# Patient Record
Sex: Female | Born: 1965 | Race: White | Hispanic: No | Smoking: Current every day smoker
Health system: Southern US, Community
[De-identification: ages and names within clinical notes are randomized; demographics above are authoritative.]

## PROBLEM LIST (undated history)

## (undated) DIAGNOSIS — M1990 Unspecified osteoarthritis, unspecified site: Secondary | ICD-10-CM

## (undated) DIAGNOSIS — J439 Emphysema, unspecified: Secondary | ICD-10-CM

## (undated) DIAGNOSIS — T7840XA Allergy, unspecified, initial encounter: Secondary | ICD-10-CM

## (undated) DIAGNOSIS — M810 Age-related osteoporosis without current pathological fracture: Secondary | ICD-10-CM

## (undated) DIAGNOSIS — F329 Major depressive disorder, single episode, unspecified: Secondary | ICD-10-CM

## (undated) DIAGNOSIS — F419 Anxiety disorder, unspecified: Secondary | ICD-10-CM

## (undated) DIAGNOSIS — G709 Myoneural disorder, unspecified: Secondary | ICD-10-CM

## (undated) DIAGNOSIS — K219 Gastro-esophageal reflux disease without esophagitis: Secondary | ICD-10-CM

## (undated) DIAGNOSIS — D649 Anemia, unspecified: Secondary | ICD-10-CM

## (undated) DIAGNOSIS — Z5189 Encounter for other specified aftercare: Secondary | ICD-10-CM

## (undated) DIAGNOSIS — R519 Headache, unspecified: Secondary | ICD-10-CM

## (undated) DIAGNOSIS — S72009A Fracture of unspecified part of neck of unspecified femur, initial encounter for closed fracture: Secondary | ICD-10-CM

## (undated) DIAGNOSIS — E079 Disorder of thyroid, unspecified: Secondary | ICD-10-CM

## (undated) DIAGNOSIS — E785 Hyperlipidemia, unspecified: Secondary | ICD-10-CM

## (undated) DIAGNOSIS — R4589 Other symptoms and signs involving emotional state: Secondary | ICD-10-CM

## (undated) DIAGNOSIS — J449 Chronic obstructive pulmonary disease, unspecified: Secondary | ICD-10-CM

## (undated) DIAGNOSIS — R4689 Other symptoms and signs involving appearance and behavior: Secondary | ICD-10-CM

## (undated) DIAGNOSIS — F319 Bipolar disorder, unspecified: Secondary | ICD-10-CM

## (undated) DIAGNOSIS — G4730 Sleep apnea, unspecified: Secondary | ICD-10-CM

## (undated) DIAGNOSIS — F32A Depression, unspecified: Secondary | ICD-10-CM

## (undated) DIAGNOSIS — R011 Cardiac murmur, unspecified: Secondary | ICD-10-CM

## (undated) DIAGNOSIS — E039 Hypothyroidism, unspecified: Secondary | ICD-10-CM

## (undated) DIAGNOSIS — S129XXA Fracture of neck, unspecified, initial encounter: Secondary | ICD-10-CM

## (undated) HISTORY — DX: Age-related osteoporosis without current pathological fracture: M81.0

## (undated) HISTORY — PX: HIP SURGERY: SHX245

## (undated) HISTORY — DX: Encounter for other specified aftercare: Z51.89

## (undated) HISTORY — DX: Major depressive disorder, single episode, unspecified: F32.9

## (undated) HISTORY — DX: Anxiety disorder, unspecified: F41.9

## (undated) HISTORY — DX: Depression, unspecified: F32.A

## (undated) HISTORY — DX: Sleep apnea, unspecified: G47.30

## (undated) HISTORY — DX: Unspecified osteoarthritis, unspecified site: M19.90

## (undated) HISTORY — DX: Allergy, unspecified, initial encounter: T78.40XA

## (undated) HISTORY — DX: Disorder of thyroid, unspecified: E07.9

## (undated) HISTORY — DX: Cardiac murmur, unspecified: R01.1

## (undated) HISTORY — PX: CLAVICLE SURGERY: SHX598

## (undated) HISTORY — PX: TUBAL LIGATION: SHX77

## (undated) HISTORY — DX: Anemia, unspecified: D64.9

## (undated) HISTORY — DX: Hyperlipidemia, unspecified: E78.5

## (undated) HISTORY — DX: Gastro-esophageal reflux disease without esophagitis: K21.9

## (undated) HISTORY — PX: HERNIA REPAIR: SHX51

## (undated) HISTORY — PX: NECK SURGERY: SHX720

## (undated) HISTORY — DX: Myoneural disorder, unspecified: G70.9

## (undated) HISTORY — PX: CHOLECYSTECTOMY: SHX55

---

## 2001-06-26 ENCOUNTER — Inpatient Hospital Stay (HOSPITAL_COMMUNITY): Admission: EM | Admit: 2001-06-26 | Discharge: 2001-07-04 | Payer: Self-pay | Admitting: *Deleted

## 2007-04-29 ENCOUNTER — Ambulatory Visit (HOSPITAL_COMMUNITY): Admission: RE | Admit: 2007-04-29 | Discharge: 2007-04-29 | Payer: Self-pay | Admitting: Neurosurgery

## 2007-05-09 ENCOUNTER — Ambulatory Visit (HOSPITAL_COMMUNITY): Admission: RE | Admit: 2007-05-09 | Discharge: 2007-05-10 | Payer: Self-pay | Admitting: Neurosurgery

## 2007-11-30 ENCOUNTER — Inpatient Hospital Stay (HOSPITAL_COMMUNITY): Admission: AD | Admit: 2007-11-30 | Discharge: 2007-12-03 | Payer: Self-pay | Admitting: *Deleted

## 2007-11-30 ENCOUNTER — Ambulatory Visit: Payer: Self-pay | Admitting: *Deleted

## 2011-04-17 NOTE — Op Note (Signed)
NAME:  Teresa Chaney, Teresa Chaney NO.:  [id number]   MEDICAL RECORD NO.:  [id number]          PATIENT TYPE:  OIB   LOCATION:  3020                         FACILITY:  [facility]   PHYSICIAN:  Coletta Memos, M.D.     DATE OF BIRTH:  1966-03-31   DATE OF PROCEDURE:  05/10/2007  DATE OF DISCHARGE:                               OPERATIVE REPORT   PREOPERATIVE DIAGNOSES:  1. Cervical spondylosis without myelopathy, C4-5.  2. Cervical radiculopathy, C5.   POSTOPERATIVE DIAGNOSES:  1. Cervical spondylosis without myelopathy, C4-5.  2. Cervical radiculopathy, C5.   PROCEDURES:  1. Anterior cervical decompression, C4-5.  2. Arthrodesis, C4-5, with 6 mm lordotic CCACF graft.  3. Anterior instrumentation, 14 mm Vector plate with 14 mm screws.   COMPLICATIONS:  None.   SURGEON:  Coletta Memos, M.D.   ASSISTANT:  Cristi Loron, M.D.   INDICATIONS:  Teresa Chaney presented with pain in the neck and right  upper extremity.  She has foraminal narrowing on the right side at C4-5.  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  I offered and she agreed to undergo operative  decompression at this level for her right upper extremity pain.   OPERATIVE NOTE:  Ms. Gopal was brought to the operating room, intubated,  and then placed under a general anesthetic without difficulty.  Her neck  was prepped and she was draped in a sterile fashion after being placed  on a horseshoe headrest in a neutral position.  I infiltrated 3 mL of  0.5% lidocaine and 1:200,000 epinephrine starting from the midline and  extending to the medial border of the left sternocleidomastoid just  below the thyroid cartilage.  I opened the skin with a #10 blade and I  took the initial incision down through the skin to the level of the  platysma.  I dissected rostrally and caudally in a plane above the  platysma.  I then opened the platysma in a horizontal fashion  using  Metzenbaum scissors.  I then dissected rostrally and caudally in a plane  inferior to the platysma.  Using a Metzenbaum scissors I was able to  dissect into a plane between the sternocleidomastoid and medial strap  muscles.  I retracted the medial strap muscles, esophagus medially and  the carotid artery and sternocleidomastoid laterally.  I placed a spinal  needle into the cervical spine and it was at C4-5.  I then reflected the  longus colli muscles, placed self-retaining retractors, and prepared for  the decompression.   I performed an anterior cervical decompression at C4-5 first by opening  the disk space.  I then used curettes to remove disk and endplate from  the vertebral bodies of C4 and C5.  I then used a high-speed drill along  with Kerrison punches to remove what was thickened posterior  longitudinal ligament.  I also used a Kerrison punch to remove  osteophytes posteriorly.  I then used a drill to thin out laterally in  the uncovertebral joints until I fully decompressed the C5  nerve root on  the right side.  The C5 nerve root on left side was also decompressed,  though there was not nearly as large an osteophyte on that side.  After  satisfying myself with decompression of both the spinal canal at that  level, which was effected with the Kerrison punches and drill, and the  nerve roots, I then irrigated.  I then prepared for the arthrodesis.   I drilled down the endplates of the vertebral bodies to prepare for the  arthrodesis.  I sized the space and felt that a 6 mm graft would work  best.  I then placed a 6 mm lordotic graft into the space.  The  distraction pins which I had placed at C4 and C5 in order aid in the  decompression were then removed.  I then proceeded with my  instrumentation.   I placed a 14 mm plate and then used a hand drill and self-tapping  screws with Dr. Lovell Sheehan' assistance and placed four screws over the  plate in place.  I then irrigated  the wound.  I then closed the wound in  a layered fashion after x-ray showed that the plate, plug and screws  were in good position.  I used Vicryl to reapproximate the platysma and  subcuticular layers.  Dermabond used for a sterile dressing.           ______________________________  Coletta Memos, M.D.     KC/MEDQ  D:  05/09/2007  T:  05/10/2007  Job:  [id number]
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
## 2011-04-20 NOTE — Discharge Summary (Signed)
[facility]  Patient:    Teresa Chaney, Teresa Chaney Visit Number: [id number] MRN: [id number]          Service Type: PSY Location: 50 0508 02 Attending Physician:  Denny Peon Dictated by:   Netta Cedars, M.D. Admit Date:  06/26/2001 Discharge Date: 07/04/2001                             Discharge Summary  INTRODUCTION:  Teresa Chaney is a 45 year old white married female, who was involuntarily committed after overdosing on July 24th on Valium and trazodone. The patient has history of depression for years.  She overdosed on 15 tablets of 5 mg Valium, 25 tablets of trazodone 50 mg and 8 Protonix tablets.  She left a note for family.  Friend found her and was taken to emergency room. The patient has several stressors.  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  The patients husband did not feel that she needed medication and he wants her to have another child, which is not in line with patients thinking.  She reports mood swings and racing thoughts.  At the time of evaluation, she still had some suicidal thoughts but not specific plans or intention and she was able to contract for safety.  The patient has been under care of [facility].  She has history of suicidal attempt at the age of 13.  SUBSTANCE ABUSE:  The patient does occasionally binge-drinking.  She denies any other substance abuse.  MEDICAL HISTORY:  She has problems with GERD and TMJ.  INITIAL IMPRESSION: Axis I:    Major depression, recurrent. Axis II:   Deferred. Axis III: Axis IV: Axis V:    Global Assessment of Functioning upon admission 20; for past year            maximum of 69.  Details of patients history are available in admission note.  HOSPITAL COURSE:  After admitting to the ward, patient was placed on special observation.  She was started on Effexor XR 37.5 mg twice a day, which was increased subsequently to 75 mg twice a  day.  Because of racing thoughts, she was started on Zyprexa.  The patient feels drugged on Zyprexa and Zyprexa was discontinued and Ambien instead was introduced for insomnia.  For next few days, Effexor was increased to 150 mg in the morning and 75 mg at night and trazodone was used for sleep.  During the meeting, on July 30, patient gave brother background information which was highly suggestive for bipolar-type illness with periods of elated mood interwoven with long periods of depression.  No history of manic episode.  I explained to patient possibility of treatment with lithium and she agreed to try.  Side effects were explained. On July 02, 2001, she seemed to be doing better but, after conversation with husband, she became more apprehensive and started having, once again, suicidal thoughts.  I decided to further increases Effexor to stabilize patients depression.  Family meeting was scheduled.  On July 03, 2001, a family meeting was held.  The patient denied any dangerous ideations.  She felt that she is ready to go home.  Husband seemed to be supportive but having vague idea about the character of patients problems.  She was doing better on lithium level 0.62.  On July 04, 2001, patient continued improved course. Denied dangerous ideations.  Still some anxiety but  will control.  Lithium was helping in stabilizing mood.  Side effects were once again discussed.  It was felt that patient benefited from this hospitalization and could be safely discharged home.  The patient understood instructions of medications and possible side effects of lithium including signs of toxicity.  MEDICAL PROBLEMS:  During this hospitalization, the patient did not have any medical problems.  PHYSICAL EXAMINATION:  Her vital signs were stable throughout hospitalization with blood pressure 120/76, normal temperature, respiration rate and pulse.  LABORATORY DATA:  The patient had normal EKG from  [facility]. Electrolytes, BUN and creatinine were normal.  CBC and differential were normal.  Chemistry 17 normal.  Urine drug screen was positive for benzodiazepines and opiates.  Pregnancy test was negative.  The patient was warned to use precautions against pregnancy while on lithium.  Thyroid panel was normal with T4 7.8, T3 30.1 and TSH 258.  DISCHARGE DIAGNOSES: Axis I:    1. Bipolar disorder, type 2, depressed.            2. Status post suicidal attempt. 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 Axis III:  1. Gastroesophageal reflux disease.            2. Temporomandibular joint.            3. Status post suicidal attempt by overdose on medication. Axis IV:   Moderate stressors (related to family problems, economical            problems). Axis V:    Global Assessment of Functioning upon admission 20; upon discharge            65; maximum for past year 69.  DISCHARGE MEDICATIONS: 1. Lithobid 300 mg twice a day. 2. Effexor XR 150 mg twice a day. 3. Seroquel 25 mg, 1/2 or 1 at bedtime, which should be used because of    insomnia and racing thoughts. 4. Lorazepam 0.5 mg up to four times a day for anxiety.  DISCHARGE RECOMMENDATIONS:  Husband agreed help to secure medication.  The patient should avoid dehydration or expose to sun while on medication.  She should check lithium level in the next 7-10 days.  Should call if problems with medication or recurrence of symptoms.  The patient was warned about lithium toxicity and lithium effects upon pregnancy.  She has appointment with [facility] on July 17, 2001 at 3:30 p.m. with Dr. Mady Haagensen.  The patient was also supposed to follow up with individual therapy.  The patient understood instructions and, in good condition, was discharged home. Dictated by:   Netta Cedars, M.D. Attending Physician:  Denny Peon DD:  08/26/01 TD:  08/27/01 Job: [id number]
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
## 2011-09-20 LAB — CBC
HCT: 37.1
Hemoglobin: 12.2
MCHC: 32.8
MCV: 85
Platelets: 241
RBC: 4.37
RDW: 14.8 — ABNORMAL HIGH
WBC: 6.6

## 2014-01-25 ENCOUNTER — Emergency Department (HOSPITAL_COMMUNITY)
Admission: EM | Admit: 2014-01-25 | Discharge: 2014-01-25 | Disposition: A | Discharge status: Home / Self Care | Payer: Medicare Other | Attending: Emergency Medicine | Admitting: Emergency Medicine

## 2014-01-25 ENCOUNTER — Encounter (HOSPITAL_COMMUNITY): Payer: Self-pay | Admitting: Emergency Medicine

## 2014-01-25 ENCOUNTER — Inpatient Hospital Stay (HOSPITAL_COMMUNITY): Admission: AD | Admit: 2014-01-25 | Payer: Medicare Other | Source: Intra-hospital | Admitting: Psychiatry

## 2014-01-25 DIAGNOSIS — F17200 Nicotine dependence, unspecified, uncomplicated: Secondary | ICD-10-CM | POA: Insufficient documentation

## 2014-01-25 DIAGNOSIS — Z8781 Personal history of (healed) traumatic fracture: Secondary | ICD-10-CM | POA: Insufficient documentation

## 2014-01-25 DIAGNOSIS — F4310 Post-traumatic stress disorder, unspecified: Secondary | ICD-10-CM

## 2014-01-25 DIAGNOSIS — F39 Unspecified mood [affective] disorder: Secondary | ICD-10-CM

## 2014-01-25 DIAGNOSIS — F329 Major depressive disorder, single episode, unspecified: Secondary | ICD-10-CM | POA: Insufficient documentation

## 2014-01-25 DIAGNOSIS — F32A Depression, unspecified: Secondary | ICD-10-CM

## 2014-01-25 DIAGNOSIS — R45851 Suicidal ideations: Secondary | ICD-10-CM | POA: Insufficient documentation

## 2014-01-25 DIAGNOSIS — F3289 Other specified depressive episodes: Secondary | ICD-10-CM

## 2014-01-25 DIAGNOSIS — Z79899 Other long term (current) drug therapy: Secondary | ICD-10-CM | POA: Insufficient documentation

## 2014-01-25 DIAGNOSIS — Z791 Long term (current) use of non-steroidal anti-inflammatories (NSAID): Secondary | ICD-10-CM | POA: Insufficient documentation

## 2014-01-25 DIAGNOSIS — F411 Generalized anxiety disorder: Secondary | ICD-10-CM

## 2014-01-25 HISTORY — DX: Other symptoms and signs involving emotional state: R45.89

## 2014-01-25 HISTORY — DX: Other symptoms and signs involving appearance and behavior: R46.89

## 2014-01-25 HISTORY — DX: Fracture of unspecified part of neck of unspecified femur, initial encounter for closed fracture: S72.009A

## 2014-01-25 HISTORY — DX: Fracture of neck, unspecified, initial encounter: S12.9XXA

## 2014-01-25 LAB — CBC WITH DIFFERENTIAL/PLATELET
Basophils Absolute: 0 10*3/uL (ref 0.0–0.1)
Basophils Relative: 0 % (ref 0–1)
Eosinophils Absolute: 0.1 10*3/uL (ref 0.0–0.7)
Eosinophils Relative: 1 % (ref 0–5)
HCT: 40.6 % (ref 36.0–46.0)
Hemoglobin: 13.4 g/dL (ref 12.0–15.0)
Lymphocytes Relative: 32 % (ref 12–46)
Lymphs Abs: 2.2 10*3/uL (ref 0.7–4.0)
MCH: 28.3 pg (ref 26.0–34.0)
MCHC: 33 g/dL (ref 30.0–36.0)
MCV: 85.8 fL (ref 78.0–100.0)
Monocytes Absolute: 0.5 10*3/uL (ref 0.1–1.0)
Monocytes Relative: 7 % (ref 3–12)
Neutro Abs: 4 10*3/uL (ref 1.7–7.7)
Neutrophils Relative %: 59 % (ref 43–77)
Platelets: 293 10*3/uL (ref 150–400)
RBC: 4.73 MIL/uL (ref 3.87–5.11)
RDW: 14.5 % (ref 11.5–15.5)
WBC: 6.8 10*3/uL (ref 4.0–10.5)

## 2014-01-25 LAB — RAPID URINE DRUG SCREEN, HOSP PERFORMED
Amphetamines: NOT DETECTED
Barbiturates: NOT DETECTED
Benzodiazepines: POSITIVE — AB
Cocaine: NOT DETECTED
Opiates: NOT DETECTED
Tetrahydrocannabinol: NOT DETECTED

## 2014-01-25 LAB — COMPREHENSIVE METABOLIC PANEL
ALT: 6 U/L (ref 0–35)
AST: 12 U/L (ref 0–37)
Albumin: 4 g/dL (ref 3.5–5.2)
Alkaline Phosphatase: 50 U/L (ref 39–117)
BUN: 6 mg/dL (ref 6–23)
CO2: 26 mEq/L (ref 19–32)
Calcium: 9.4 mg/dL (ref 8.4–10.5)
Chloride: 101 mEq/L (ref 96–112)
Creatinine, Ser: 0.65 mg/dL (ref 0.50–1.10)
GFR calc Af Amer: >90 mL/min (ref >90)
GFR calc non Af Amer: >90 mL/min (ref >90)
Glucose, Bld: 87 mg/dL (ref 70–99)
Potassium: 4.3 mEq/L (ref 3.7–5.3)
Sodium: 138 mEq/L (ref 137–147)
Total Bilirubin: 0.2 mg/dL — ABNORMAL LOW (ref 0.3–1.2)
Total Protein: 7.2 g/dL (ref 6.0–8.3)

## 2014-01-25 LAB — ETHANOL: Alcohol, Ethyl (B): <11 mg/dL (ref 0–11)

## 2014-01-25 MED ORDER — ACETAMINOPHEN 325 MG PO TABS
650.0000 mg | ORAL_TABLET | ORAL | Status: DC | PRN
Start: 2014-01-25 — End: 2014-01-26

## 2014-01-25 MED ORDER — ALUM & MAG HYDROXIDE-SIMETH 200-200-20 MG/5ML PO SUSP: 30.0000 mL | ORAL | Status: DC | PRN

## 2014-01-25 MED ORDER — LORAZEPAM 1 MG PO TABS: 1.0000 mg | ORAL_TABLET | Freq: Three times a day (TID) | ORAL | Status: DC | PRN

## 2014-01-25 MED ORDER — ONDANSETRON HCL 4 MG PO TABS: 4.0000 mg | ORAL_TABLET | Freq: Three times a day (TID) | ORAL | Status: DC | PRN

## 2014-01-25 MED ORDER — PANTOPRAZOLE SODIUM 40 MG PO TBEC: 40.0000 mg | DELAYED_RELEASE_TABLET | Freq: Every day | ORAL | Status: DC

## 2014-01-25 MED ORDER — NICOTINE 21 MG/24HR TD PT24: 21.0000 mg | MEDICATED_PATCH | Freq: Every day | TRANSDERMAL | Status: DC

## 2014-01-25 MED ORDER — IBUPROFEN 200 MG PO TABS: 600.0000 mg | ORAL_TABLET | Freq: Three times a day (TID) | ORAL | Status: DC | PRN

## 2014-01-25 MED ORDER — ZOLPIDEM TARTRATE 5 MG PO TABS: 5.0000 mg | ORAL_TABLET | Freq: Every evening | ORAL | Status: DC | PRN

## 2014-01-25 NOTE — ED Notes (Signed)
Pt states therapist from [facility] sent her for evaluation; for depression/isolating herself and suicidal thoughts without a plan; previous stay at [facility] for attempted suicide 2002

## 2014-01-25 NOTE — Discharge Instructions (Signed)
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
## 2014-01-25 NOTE — ED Notes (Signed)
Patient contracts for safety at discharge and denies SI, HI, and AVH at this time.

## 2014-01-25 NOTE — ED Provider Notes (Signed)
CSN: [id number]     Arrival date & time 01/25/14  1347 History   First MD Initiated Contact with Patient 01/25/14 1510     Chief Complaint  Patient presents with  . Medical Clearance     (Consider location/radiation/quality/duration/timing/severity/associated sxs/prior Treatment) The history is provided by the patient and medical records.   This is a 48 y.o. F presenting to the ED for increased depression, anxiety, and suicidal ideation without plan.  Pt states over the past several weeks her sx have been steadily worsening.  Denies any recent new stressors at home but states she thinks her PTSD is flaring up again and causing her to have racing thoughts.  She states she has stopped going to her therapy and pain management appointments as has basically been isolating herself at home alone.  She notes poor PO intake and decreased sleep due to racing thoughts.  She denies HI or AVH.  No illicit drug or EtOH.  States she finally decided to discuss this with her counselor at [facility] who encouraged her to be evaluated in the ED.  Pt had prior suicide attempt in 2002 via OD-- completed IP tx at [facility] after this.  VS stable on arrival.  Past Medical History  Diagnosis Date  . Suicidal behavior   . Hip fracture   . Neck fracture    Past Surgical History  Procedure Laterality Date  . Neck surgery    . Clavicle surgery    . Hip surgery     No family history on file. History  Substance Use Topics  . Smoking status: Current Every Day Smoker -- 1.00 packs/day    Types: Cigarettes  . Smokeless tobacco: Not on file  . Alcohol Use: No   OB History   Grav Para Term Preterm Abortions TAB SAB Ect Mult Living                 Review of Systems  Psychiatric/Behavioral: Positive for suicidal ideas.  All other systems reviewed and are negative.   Allergies  Codeine and Tussin  Home Medications   Current Outpatient Rx  Name  Route  Sig  Dispense  Refill  . acetaminophen (TYLENOL) 325 MG  tablet   Oral   Take 650 mg by mouth every 6 (six) hours as needed (pain).         Marland Kitchen HYDROcodone-acetaminophen (NORCO/VICODIN) 5-325 MG per tablet   Oral   Take 1 tablet by mouth every 6 (six) hours as needed for moderate pain (pain).         Marland Kitchen lansoprazole (PREVACID) 15 MG capsule   Oral   Take 15 mg by mouth daily at 12 noon.         . meloxicam (MOBIC) 7.5 MG tablet   Oral   Take 7.5 mg by mouth every 12 (twelve) hours.         . ondansetron (ZOFRAN) 4 MG tablet   Oral   Take 4 mg by mouth every 8 (eight) hours as needed for nausea or vomiting (nausea).          BP 113/70  Pulse 89  Temp(Src) 98.2 F (36.8 C)  Resp 20  SpO2 100%  LMP 01/11/2014  Physical Exam  Nursing note and vitals reviewed. Constitutional: She is oriented to person, place, and time. She appears well-developed and well-nourished. No distress.  HENT:  Head: Normocephalic and atraumatic.  Mouth/Throat: Oropharynx is clear and moist.  Eyes: Conjunctivae and EOM are normal. Pupils are equal,  round, and reactive to light.  Neck: Normal range of motion.  Cardiovascular: Normal rate, regular rhythm and normal heart sounds.   Pulmonary/Chest: Effort normal and breath sounds normal. No respiratory distress. She has no wheezes.  Abdominal: Soft. Bowel sounds are normal. There is no tenderness.  Musculoskeletal: Normal range of motion. She exhibits no edema.  Neurological: She is alert and oriented to person, place, and time.  Skin: Skin is warm and dry. She is not diaphoretic.  Psychiatric: Her speech is normal. She is not actively hallucinating. Thought content is not delusional. She exhibits a depressed mood. She expresses suicidal ideation. She expresses no homicidal ideation. She expresses no suicidal plans and no homicidal plans.  Depressed mood    ED Course  Procedures (including critical care time) Labs Review Labs Reviewed  URINE RAPID DRUG SCREEN (HOSP PERFORMED) - Abnormal; Notable  for the following:    Benzodiazepines POSITIVE (*)    All other components within normal limits  COMPREHENSIVE METABOLIC PANEL - Abnormal; Notable for the following:    Total Bilirubin 0.2 (*)    All other components within normal limits  CBC WITH DIFFERENTIAL  ETHANOL   Imaging Review No results found.  EKG Interpretation   None       MDM   Final diagnoses:  Depression   Pt with increased depression and anxiety over the past several weeks, now with SI without plan.  Labs obtained, largely WNL.  Pt medically cleared and moved to psych ED awaiting TTS evaluation.  TTS has evaluated pt, feels that since she is able to contract for safety and has OP resources including her current therapist at [facility] she may be reasonably discharged.  Larene Pickett, PA-C 01/26/14 0006

## 2014-01-25 NOTE — Progress Notes (Signed)
   CARE MANAGEMENT ED NOTE 01/25/2014  Patient:  Teresa Chaney, Teresa Chaney   Account Number:  [id number]  Date Initiated:  01/25/2014  Documentation initiated by:  Jackelyn Poling  Subjective/Objective Assessment:   48 yr old medicare pt states pcp is Dr Heide Scales increased depression, anxiety, and suicidal ideation without plan. PMH hip/neck fx/Suidical behavior     Subjective/Objective Assessment Detail:     Action/Plan:   cm spoke with pt updated epic   Action/Plan Detail:   Anticipated DC Date:       Status Recommendation to Physician:   Result of Recommendation:    Other ED Services  Consult Working [facility]  Other  Outpatient Services - Pt will follow up  PCP issues    Choice offered to / List presented to:            Status of service:  Completed, signed off  ED Comments:   ED Comments Detail:

## 2014-01-26 NOTE — ED Provider Notes (Signed)
Medical screening examination/treatment/procedure(s) were conducted as a shared visit with non-physician practitioner(s) and myself.  I personally evaluated the patient during the encounter.  EKG Interpretation   None       Patient not suicidal on my re-examination. Psych has evaluated patient and feels she is stable for outpatient management. Discussed strict return precautions with patient.   Ephraim Hamburger, MD 01/26/14 [phone]

## 2017-02-15 DIAGNOSIS — Z803 Family history of malignant neoplasm of breast: Secondary | ICD-10-CM | POA: Diagnosis not present

## 2017-02-15 DIAGNOSIS — Z720 Tobacco use: Secondary | ICD-10-CM | POA: Diagnosis not present

## 2017-02-15 DIAGNOSIS — Z800 Family history of malignant neoplasm of digestive organs: Secondary | ICD-10-CM | POA: Diagnosis not present

## 2017-02-15 DIAGNOSIS — D751 Secondary polycythemia: Secondary | ICD-10-CM | POA: Diagnosis not present

## 2018-07-29 ENCOUNTER — Encounter: Payer: Self-pay | Admitting: Gastroenterology

## 2018-12-12 ENCOUNTER — Encounter: Payer: Self-pay | Admitting: Gastroenterology

## 2019-01-13 ENCOUNTER — Ambulatory Visit (AMBULATORY_SURGERY_CENTER): Payer: Self-pay

## 2019-01-13 VITALS — Ht 63.0 in | Wt 131.2 lb

## 2019-01-13 DIAGNOSIS — Z1211 Encounter for screening for malignant neoplasm of colon: Secondary | ICD-10-CM

## 2019-01-13 MED ORDER — NA SULFATE-K SULFATE-MG SULF 17.5-3.13-1.6 GM/177ML PO SOLN: 1.0000 | Freq: Once | ORAL | 0 refills | Status: AC

## 2019-01-13 NOTE — Progress Notes (Signed)
Denies allergies to eggs or soy products. Denies complication of anesthesia or sedation. Denies use of weight loss medication. Denies use of O2.   Emmi instructions were given to the patient.

## 2019-01-20 ENCOUNTER — Ambulatory Visit (AMBULATORY_SURGERY_CENTER): Payer: Medicare Other | Admitting: Gastroenterology

## 2019-01-20 ENCOUNTER — Encounter: Payer: Self-pay | Admitting: Gastroenterology

## 2019-01-20 VITALS — BP 116/73 | HR 80 | Temp 97.5°F | Resp 14 | Ht 63.0 in | Wt 131.0 lb

## 2019-01-20 DIAGNOSIS — Z1211 Encounter for screening for malignant neoplasm of colon: Secondary | ICD-10-CM | POA: Diagnosis not present

## 2019-01-20 DIAGNOSIS — D123 Benign neoplasm of transverse colon: Secondary | ICD-10-CM | POA: Diagnosis not present

## 2019-01-20 MED ORDER — SODIUM CHLORIDE 0.9 % IV SOLN: 500.0000 mL | Freq: Once | INTRAVENOUS | Status: DC

## 2019-01-20 NOTE — Progress Notes (Signed)
Called to room to assist during endoscopic procedure.  Patient ID and intended procedure confirmed with present staff. Received instructions for my participation in the procedure from the performing physician.  

## 2019-01-20 NOTE — Patient Instructions (Signed)
Discharge instructions given. Handouts on polyps,diverticulosis and hemorrhoids. Resume previous medications. YOU HAD AN ENDOSCOPIC PROCEDURE TODAY AT THE [facility]:   Refer to the procedure report that was given to you for any specific questions about what was found during the examination.  If the procedure report does not answer your questions, please call your gastroenterologist to clarify.  If you requested that your care partner not be given the details of your procedure findings, then the procedure report has been included in a sealed envelope for you to review at your convenience later.  YOU SHOULD EXPECT: Some feelings of bloating in the abdomen. Passage of more gas than usual.  Walking can help get rid of the air that was put into your GI tract during the procedure and reduce the bloating. If you had a lower endoscopy (such as a colonoscopy or flexible sigmoidoscopy) you may notice spotting of blood in your stool or on the toilet paper. If you underwent a bowel prep for your procedure, you may not have a normal bowel movement for a few days.  Please Note:  You might notice some irritation and congestion in your nose or some drainage.  This is from the oxygen used during your procedure.  There is no need for concern and it should clear up in a day or so.  SYMPTOMS TO REPORT IMMEDIATELY:   Following lower endoscopy (colonoscopy or flexible sigmoidoscopy):  Excessive amounts of blood in the stool  Significant tenderness or worsening of abdominal pains  Swelling of the abdomen that is new, acute  Fever of 100F or higher   For urgent or emergent issues, a gastroenterologist can be reached at any hour by calling [phone].   DIET:  We do recommend a small meal at first, but then you may proceed to your regular diet.  Drink plenty of fluids but you should avoid alcoholic beverages for 24 hours.  ACTIVITY:  You should plan to take it easy for the rest of today and you  should NOT DRIVE or use heavy machinery until tomorrow (because of the sedation medicines used during the test).    FOLLOW UP: Our staff will call the number listed on your records the next business day following your procedure to check on you and address any questions or concerns that you may have regarding the information given to you following your procedure. If we do not reach you, we will leave a message.  However, if you are feeling well and you are not experiencing any problems, there is no need to return our call.  We will assume that you have returned to your regular daily activities without incident.  If any biopsies were taken you will be contacted by phone or by letter within the next 1-3 weeks.  Please call us at [phone] if you have not heard about the biopsies in 3 weeks.    SIGNATURES/CONFIDENTIALITY: You and/or your care partner have signed paperwork which will be entered into your electronic medical record.  These signatures attest to the fact that that the information above on your After Visit Summary has been reviewed and is understood.  Full responsibility of the confidentiality of this discharge information lies with you and/or your care-partner. 

## 2019-01-20 NOTE — Progress Notes (Signed)
Report to PACU, RN, vss, BBS= Clear.  

## 2019-01-20 NOTE — Progress Notes (Signed)
Pt's states no medical or surgical changes since previsit or office visit. 

## 2019-01-20 NOTE — Op Note (Addendum)
[place] Patient Name: Teresa Chaney Procedure Date: 01/20/2019 10:24 AM MRN: [id number] Endoscopist: Jackquline Denmark , MD Age: 53 Referring MD:  Date of Birth: [birth year] Gender: Female Account #: [id number] Procedure:                Colonoscopy Indications:              High risk colon cancer surveillance: Personal                            history of colonic polyps Medicines:                Monitored Anesthesia Care Procedure:                Pre-Anesthesia Assessment:                           - Prior to the procedure, a History and Physical                            was performed, and patient medications and                            allergies were reviewed. The patient's tolerance of                            previous anesthesia was also reviewed. The risks                            and benefits of the procedure and the sedation                            options and risks were discussed with the patient.                            All questions were answered, and informed consent                            was obtained. Prior Anticoagulants: The patient has                            taken no previous anticoagulant or antiplatelet                            agents. ASA Grade Assessment: II - A patient with                            mild systemic disease. After reviewing the risks                            and benefits, the patient was deemed in                            satisfactory condition to undergo the procedure.  After obtaining informed consent, the colonoscope                            was passed under direct vision. Throughout the                            procedure, the patient's blood pressure, pulse, and                            oxygen saturations were monitored continuously. The                            Colonoscope was introduced through the anus and                            advanced to the 2 cm into the ileum. The                             colonoscopy was somewhat difficult due to a                            tortuous colon. Successful completion of the                            procedure was aided by applying abdominal pressure.                            The patient tolerated the procedure well. The                            quality of the bowel preparation was excellent. Scope In: 10:32:58 AM Scope Out: 10:50:39 AM Scope Withdrawal Time: 0 hours 12 minutes 47 seconds  Total Procedure Duration: 0 hours 17 minutes 41 seconds  Findings:                 Two sessile polyps were found in the proximal                            transverse colon. The polyps were 4 to 6 mm in                            size. These polyps were removed with a cold snare.                            Resection and retrieval were complete. Estimated                            blood loss: none.                           A few rare small-mouthed diverticula were found in                            the sigmoid colon  and ascending colon.                           Non-bleeding internal hemorrhoids were found during                            retroflexion. The hemorrhoids were small.                           The terminal ileum appeared normal.                           The exam was otherwise without abnormality on                            direct and retroflexion views. Complications:            No immediate complications. Estimated Blood Loss:     Estimated blood loss: none. Impression:               - Two 4 to 6 mm polyps in the proximal transverse                            colon, removed with a cold snare. Resected and                            retrieved.                           - Very mild pancolonic diverticulosis.                           - Non-bleeding internal hemorrhoids.                           - Otherwise normal colonoscopy to TI. Recommendation:           - Patient has a contact number available for                             emergencies. The signs and symptoms of potential                            delayed complications were discussed with the                            patient. Return to normal activities tomorrow.                            Written discharge instructions were provided to the                            patient.                           - Resume previous diet.                           -  Continue present medications.                           - Await pathology results.                           - Repeat colonoscopy for surveillance based on                            pathology results.                           - Return to GI clinic PRN. Jackquline Denmark, MD 01/20/2019 10:55:30 AM This report has been signed electronically. Addendum Number: 1   Addendum Date: 02/04/2019 12:11:57 PM      Indication for colonoscopy-colorectal cancer screening. Jackquline Denmark, MD 02/04/2019 12:12:25 PM This report has been signed electronically.

## 2019-01-21 ENCOUNTER — Telehealth: Payer: Self-pay | Admitting: *Deleted

## 2019-01-21 NOTE — Telephone Encounter (Signed)
  Follow up Call-  Call back number 01/20/2019  Post procedure Call Back phone  # [phone]  Permission to leave phone message Yes  Some recent data might be hidden     Patient questions:  Do you have a fever, pain , or abdominal swelling? No. Pain Score  0 *  Have you tolerated food without any problems? Yes.    Have you been able to return to your normal activities? Yes.    Do you have any questions about your discharge instructions: Diet   No. Medications  No. Follow up visit  No.  Do you have questions or concerns about your Care? No.  Actions: * If pain score is 4 or above: No action needed, pain <4.

## 2019-01-23 ENCOUNTER — Encounter: Payer: Self-pay | Admitting: Gastroenterology

## 2019-12-04 HISTORY — PX: THYROIDECTOMY: SHX17

## 2021-05-12 ENCOUNTER — Other Ambulatory Visit: Payer: Self-pay | Admitting: Pediatrics

## 2021-05-12 ENCOUNTER — Other Ambulatory Visit: Payer: Self-pay | Admitting: Neurosurgery

## 2021-05-12 ENCOUNTER — Other Ambulatory Visit (HOSPITAL_COMMUNITY): Payer: Self-pay | Admitting: Neurosurgery

## 2021-05-12 DIAGNOSIS — M4722 Other spondylosis with radiculopathy, cervical region: Secondary | ICD-10-CM

## 2021-05-24 ENCOUNTER — Inpatient Hospital Stay (HOSPITAL_COMMUNITY): Admission: RE | Admit: 2021-05-24 | Payer: Medicare Other | Source: Ambulatory Visit

## 2021-05-24 ENCOUNTER — Ambulatory Visit (HOSPITAL_COMMUNITY): Payer: Medicare Other

## 2021-05-24 ENCOUNTER — Encounter (HOSPITAL_COMMUNITY): Payer: Self-pay

## 2021-10-06 ENCOUNTER — Other Ambulatory Visit: Payer: Self-pay | Admitting: Emergency Medicine

## 2021-10-06 ENCOUNTER — Other Ambulatory Visit (HOSPITAL_COMMUNITY): Payer: Self-pay | Admitting: Neurosurgery

## 2021-10-06 ENCOUNTER — Other Ambulatory Visit: Payer: Self-pay | Admitting: Neurosurgery

## 2021-10-06 DIAGNOSIS — M4722 Other spondylosis with radiculopathy, cervical region: Secondary | ICD-10-CM

## 2021-11-01 ENCOUNTER — Ambulatory Visit (HOSPITAL_COMMUNITY)
Admission: RE | Admit: 2021-11-01 | Discharge: 2021-11-01 | Disposition: A | Discharge status: Home / Self Care | Payer: Medicare Other | Source: Ambulatory Visit | Attending: Neurosurgery | Admitting: Neurosurgery

## 2021-11-01 ENCOUNTER — Other Ambulatory Visit: Payer: Self-pay

## 2021-11-01 DIAGNOSIS — R200 Anesthesia of skin: Secondary | ICD-10-CM | POA: Insufficient documentation

## 2021-11-01 DIAGNOSIS — M4722 Other spondylosis with radiculopathy, cervical region: Secondary | ICD-10-CM | POA: Insufficient documentation

## 2021-11-01 DIAGNOSIS — M4802 Spinal stenosis, cervical region: Secondary | ICD-10-CM | POA: Diagnosis not present

## 2021-11-01 MED ORDER — DEXAMETHASONE 4 MG PO TABS
4.0000 mg | ORAL_TABLET | Freq: Once | ORAL | Status: DC
  Filled 2021-11-01: qty 1

## 2021-11-01 MED ORDER — DIAZEPAM 5 MG PO TABS
ORAL_TABLET | ORAL | Status: AC
  Administered 2021-11-01: 10 mg via ORAL
  Filled 2021-11-01: qty 2

## 2021-11-01 MED ORDER — LIDOCAINE HCL (PF) 1 % IJ SOLN
5.0000 mL | Freq: Once | INTRAMUSCULAR | Status: AC
  Administered 2021-11-01: 5 mL via INTRADERMAL

## 2021-11-01 MED ORDER — IOHEXOL 300 MG/ML  SOLN
10.0000 mL | Freq: Once | INTRAMUSCULAR | Status: AC | PRN
  Administered 2021-11-01: 10 mL via INTRATHECAL

## 2021-11-01 MED ORDER — ONDANSETRON HCL 4 MG/2ML IJ SOLN: 4.0000 mg | Freq: Four times a day (QID) | INTRAMUSCULAR | Status: DC | PRN

## 2021-11-01 MED ORDER — DIAZEPAM 5 MG PO TABS: 10.0000 mg | ORAL_TABLET | Freq: Once | ORAL | Status: AC

## 2021-11-01 MED ORDER — OXYCODONE HCL 5 MG PO TABS: 5.0000 mg | ORAL_TABLET | ORAL | Status: DC | PRN

## 2021-11-01 NOTE — Op Note (Signed)
11/01/2021 Cervical Myelogram  PATIENT:  Teresa Chaney is a 55 y.o. female with cervical pain   PRE-OPERATIVE DIAGNOSIS:  cervicalgia  POST-OPERATIVE DIAGNOSIS:  cervicalgia  PROCEDURE:  Lumbar Myelogram  SURGEON:  Nabeel Gladson  ANESTHESIA:   local LOCAL MEDICATIONS USED:  LIDOCAINE  Procedure Note: Teresa Chaney is a 55 y.o. female Was taken to the fluoroscopy suite and  positioned prone on the fluoroscopy table. Her back was prepared and draped in a sterile manner. I infiltrated 9 cc into the lumbar region. I then introduced a spinal needle into the thecal sac at the L4/5 interlaminar space. I infiltrated 10cc of Isovue 300 into the thecal sac. Fluoroscopy showed the needle and contrast in the thecal sac. Teresa Chaney tolerated the procedure well. she Will be taken to CT for evaluation.     PATIENT DISPOSITION:  Short Stay

## 2021-11-14 ENCOUNTER — Other Ambulatory Visit: Payer: Self-pay | Admitting: Neurosurgery

## 2021-11-21 ENCOUNTER — Encounter (HOSPITAL_COMMUNITY): Payer: Self-pay | Admitting: Physician Assistant

## 2021-11-22 ENCOUNTER — Encounter (HOSPITAL_COMMUNITY): Admission: RE | Payer: Self-pay | Source: Home / Self Care

## 2021-11-22 ENCOUNTER — Ambulatory Visit (HOSPITAL_COMMUNITY): Admission: RE | Admit: 2021-11-22 | Payer: Medicare Other | Source: Home / Self Care | Admitting: Neurosurgery

## 2021-11-22 SURGERY — POSTERIOR CERVICAL FUSION/FORAMINOTOMY LEVEL 1
Anesthesia: General

## 2021-11-24 ENCOUNTER — Other Ambulatory Visit: Payer: Self-pay | Admitting: Neurosurgery

## 2021-12-15 NOTE — Pre-Procedure Instructions (Signed)
Surgical Instructions    Your procedure is scheduled on Wednesday, January 18th.  Report to [facility] Main Entrance "A" at 8:00 A.M., then check in with the Admitting office.  Call this number if you have problems the morning of surgery:  [phone]   If you have any questions prior to your surgery date call [phone]: Open Monday-Friday 8am-4pm    Remember:  Do not eat after midnight the night before your surgery  You may drink clear liquids until 7:00 a.m. the morning of your surgery.   Clear liquids allowed are: Water, Non-Citrus Juices (without pulp), Carbonated Beverages, Clear Tea, Black Coffee Only, and Gatorade    Take these medicines the morning of surgery with A SIP OF WATER  ARIPiprazole (ABILIFY) dexlansoprazole (DEXILANT)  gabapentin (NEURONTIN)  levothyroxine (SYNTHROID) methocarbamol (ROBAXIN)  oxyCODONE-acetaminophen (PERCOCET)   Take these medications as needed: acetaminophen (TYLENOL)  Albuterol inhaler-please bring with you to the hospital Albuterol nebulizer budesonide (PULMICORT)  diazepam (VALIUM)  fluticasone (FLONASE)  meclizine (ANTIVERT)  promethazine (PHENERGAN)  STIOLTO  tizanidine (ZANAFLEX)  valACYclovir (VALTREX)  As of today, STOP taking any Aspirin (unless otherwise instructed by your surgeon) Aleve, Naproxen, Ibuprofen, Motrin, Advil, Goody's, BC's, all herbal medications, fish oil, and all vitamins.                     Do NOT Smoke (Tobacco/Vaping) or drink Alcohol 24 hours prior to your procedure.  If you use a CPAP at night, you may bring all equipment for your overnight stay.   Contacts, glasses, piercing's, hearing aid's, dentures or partials may not be worn into surgery, please bring cases for these belongings.    For patients admitted to the hospital, discharge time will be determined by your treatment team.   Patients discharged the day of surgery will not be allowed to drive home, and someone needs to stay with them for  24 hours.  NO VISITORS WILL BE ALLOWED IN PRE-OP WHERE PATIENTS GET READY FOR SURGERY.  ONLY 1 SUPPORT PERSON MAY BE PRESENT IN THE WAITING ROOM WHILE YOU ARE IN SURGERY.  IF YOU ARE TO BE ADMITTED, ONCE YOU ARE IN YOUR ROOM YOU WILL BE ALLOWED TWO (2) VISITORS.  Minor children may have two parents present. Special consideration for safety and communication needs will be reviewed on a case by case basis.   Special instructions:   [place]- Preparing For Surgery  Before surgery, you can play an important role. Because skin is not sterile, your skin needs to be as free of germs as possible. You can reduce the number of germs on your skin by washing with CHG (chlorahexidine gluconate) Soap before surgery.  CHG is an antiseptic cleaner which kills germs and bonds with the skin to continue killing germs even after washing.    Oral Hygiene is also important to reduce your risk of infection.  Remember - BRUSH YOUR TEETH THE MORNING OF SURGERY WITH YOUR REGULAR TOOTHPASTE  Please do not use if you have an allergy to CHG or antibacterial soaps. If your skin becomes reddened/irritated stop using the CHG.  Do not shave (including legs and underarms) for at least 48 hours prior to first CHG shower. It is OK to shave your face.  Please follow these instructions carefully.   Shower the NIGHT BEFORE SURGERY and the MORNING OF SURGERY  If you chose to wash your hair, wash your hair first as usual with your normal shampoo.  After you shampoo, rinse your hair  and body thoroughly to remove the shampoo.  Use CHG Soap as you would any other liquid soap. You can apply CHG directly to the skin and wash gently with a scrungie or a clean washcloth.   Apply the CHG Soap to your body ONLY FROM THE NECK DOWN.  Do not use on open wounds or open sores. Avoid contact with your eyes, ears, mouth and genitals (private parts). Wash Face and genitals (private parts)  with your normal soap.   Wash thoroughly, paying  special attention to the area where your surgery will be performed.  Thoroughly rinse your body with warm water from the neck down.  DO NOT shower/wash with your normal soap after using and rinsing off the CHG Soap.  Pat yourself dry with a CLEAN TOWEL.  Wear CLEAN PAJAMAS to bed the night before surgery  Place CLEAN SHEETS on your bed the night before your surgery  DO NOT SLEEP WITH PETS.   Day of Surgery: Shower with CHG soap. Do not wear jewelry, make up, nail polish, gel polish, artificial nails, or any other type of covering on natural nails including finger and toenails. If patients have artificial nails, gel coating, etc. that need to be removed by a nail salon please have this removed prior to surgery. Surgery may need to be canceled/delayed if the surgeon/ anesthesia feels like the patient is unable to be adequately monitored. Do not wear lotions, powders, perfumes, or deodorant. Do not shave 48 hours prior to surgery.   Do not bring valuables to the hospital. [facility] is not responsible for any belongings or valuables. Wear Clean/Comfortable clothing the morning of surgery Remember to brush your teeth WITH YOUR REGULAR TOOTHPASTE.   Please read over the following fact sheets that you were given.   3 days prior to your procedure or After your COVID test   You are not required to quarantine however you are required to wear a well-fitting mask when you are out and around people not in your household. If your mask becomes wet or soiled, replace with a new one.   Wash your hands often with soap and water for 20 seconds or clean your hands with an alcohol-based hand sanitizer that contains at least 60% alcohol.   Do not share personal items.   Notify your provider:  o if you are in close contact with someone who has COVID  o or if you develop a fever of 100.4 or greater, sneezing, cough, sore throat, shortness of breath or body aches.

## 2021-12-18 ENCOUNTER — Inpatient Hospital Stay (HOSPITAL_COMMUNITY)
Admission: RE | Admit: 2021-12-18 | Discharge: 2021-12-18 | Disposition: A | Discharge status: Home / Self Care | Payer: Medicare Other | Source: Ambulatory Visit

## 2021-12-19 ENCOUNTER — Other Ambulatory Visit: Payer: Self-pay

## 2021-12-19 ENCOUNTER — Encounter (HOSPITAL_COMMUNITY): Payer: Self-pay

## 2021-12-19 ENCOUNTER — Encounter (HOSPITAL_COMMUNITY)
Admission: RE | Admit: 2021-12-19 | Discharge: 2021-12-19 | Disposition: A | Discharge status: Home / Self Care | Payer: Medicare Other | Source: Ambulatory Visit | Attending: Neurosurgery | Admitting: Neurosurgery

## 2021-12-19 VITALS — BP 125/73 | HR 98 | Temp 98.3°F | Resp 18 | Ht 63.0 in | Wt 115.7 lb

## 2021-12-19 DIAGNOSIS — J449 Chronic obstructive pulmonary disease, unspecified: Secondary | ICD-10-CM | POA: Insufficient documentation

## 2021-12-19 DIAGNOSIS — G4736 Sleep related hypoventilation in conditions classified elsewhere: Secondary | ICD-10-CM | POA: Insufficient documentation

## 2021-12-19 DIAGNOSIS — Z9981 Dependence on supplemental oxygen: Secondary | ICD-10-CM | POA: Insufficient documentation

## 2021-12-19 DIAGNOSIS — Z01812 Encounter for preprocedural laboratory examination: Secondary | ICD-10-CM | POA: Insufficient documentation

## 2021-12-19 DIAGNOSIS — Z789 Other specified health status: Secondary | ICD-10-CM

## 2021-12-19 DIAGNOSIS — Z20822 Contact with and (suspected) exposure to covid-19: Secondary | ICD-10-CM | POA: Insufficient documentation

## 2021-12-19 DIAGNOSIS — Z01818 Encounter for other preprocedural examination: Secondary | ICD-10-CM

## 2021-12-19 HISTORY — DX: Bipolar disorder, unspecified: F31.9

## 2021-12-19 HISTORY — DX: Hypothyroidism, unspecified: E03.9

## 2021-12-19 HISTORY — DX: Headache, unspecified: R51.9

## 2021-12-19 HISTORY — DX: Emphysema, unspecified: J43.9

## 2021-12-19 HISTORY — DX: Chronic obstructive pulmonary disease, unspecified: J44.9

## 2021-12-19 LAB — CBC
HCT: 46.4 % — ABNORMAL HIGH (ref 36.0–46.0)
Hemoglobin: 15.2 g/dL — ABNORMAL HIGH (ref 12.0–15.0)
MCH: 30.4 pg (ref 26.0–34.0)
MCHC: 32.8 g/dL (ref 30.0–36.0)
MCV: 92.8 fL (ref 80.0–100.0)
Platelets: 282 10*3/uL (ref 150–400)
RBC: 5 MIL/uL (ref 3.87–5.11)
RDW: 14.9 % (ref 11.5–15.5)
WBC: 8.2 10*3/uL (ref 4.0–10.5)
nRBC: 0 % (ref 0.0–0.2)

## 2021-12-19 LAB — TYPE AND SCREEN
ABO/RH(D): A POS
Antibody Screen: NEGATIVE

## 2021-12-19 LAB — SURGICAL PCR SCREEN
MRSA, PCR: NEGATIVE
Staphylococcus aureus: NEGATIVE

## 2021-12-19 LAB — SARS CORONAVIRUS 2 (TAT 6-24 HRS): SARS Coronavirus 2: NEGATIVE

## 2021-12-19 LAB — ABO/RH: ABO/RH(D): A POS

## 2021-12-19 NOTE — Progress Notes (Signed)
PCP - [facility]inal[facility]) Cardiologist - denies  Pt saw Dr. Lanette Hampshire Otho Perl 07/2021 for Echo & EKG EKG tracing requested   Chest x-ray - 11/20/21 (CE) EKG - 08/02/21 (tracing requested) Stress Test - 10/04/21 ECHO - 10/04/21   ERAS Protcol - yes, no drink ordered or given   COVID TEST- 12/19/21 (outpatient in bed)   Anesthesia review: yes, history of heart murmur, no other heart issues (per patient) Jeneen Rinks notified of recent EKG, requested EKG tracing per Jeneen Rinks  Patient denies shortness of breath, fever, cough and chest pain at PAT appointment   All instructions explained to the patient, with a verbal understanding of the material. Patient agrees to go over the instructions while at home for a better understanding. Patient also instructed to self quarantine after being tested for COVID-19. The opportunity to ask questions was provided.

## 2021-12-19 NOTE — Progress Notes (Signed)
Anesthesia Chart Review:  Recent cardiology eval by Dr. Otho Perl at [facility]. Treadmill exercise test 11/22 showed fair exercise capacity, no ischemia. Echo 11/22 showed EF 55-60%, no significant valvular abnormalities. She also wore a zio patch monitor, however results are not available in care everywhere.   Follows with pulmonology at [facility] for history of moderate COPD and nocturnal hypoxemia.  Is maintained on Stiolto and as needed albuterol.  She is prescribed 2 L O2 nightly.  She had acute COPD exacerbation 11/20/2021 treated by PCP with azithromycin and steroids.  CBC 12/19/2021 reviewed, unremarkable.  CMP 11/20/2021 in [place] reviewed, unremarkable.  EKG 08/02/2021 (Care Everywhere, tracing requested): Sinus rhythm.  Rate 85.  Possible LAE.  CHEST - 2 VIEW (Care Everywhere) 11/20/2021: COMPARISON: 10/12/2021  FINDINGS: Normal heart size, mediastinal contours, and pulmonary vascularity.  Atherosclerotic calcification aorta.  Lungs mildly hyperinflated with central peribronchial thickening.  No pulmonary infiltrate, pleural effusion, or pneumothorax.  Prior ORIF RIGHT clavicle.  Prior cervical spine fusion.  IMPRESSION: COPD changes without acute infiltrate.  TTE 10/04/21 (care everywhere): SUMMARY  Left ventricular systolic function is normal.  LV ejection fraction = 55-60%.  There is no significant valvular stenosis or regurgitation.  There is no comparison study available.    Exercise stress test 10/04/21 (care everywhere): 1.  Exercise capacity was fair for age exercised for 4 minutes and 19 seconds of the 2-minute Bruce protocol achieving 7 METS.  2.  The stress test was negative for ischemia with no diagnostic EKG changes at target heart rate.     Wynonia Musty [facility] Short Stay Center/Anesthesiology Phone [phone] 12/19/2021 3:25 PM

## 2021-12-19 NOTE — Anesthesia Preprocedure Evaluation (Addendum)
Anesthesia Evaluation  Patient identified by MRN, date of birth, ID band Patient awake    Reviewed: Allergy & Precautions, NPO status , Patient's Chart, lab work & pertinent test results  History of Anesthesia Complications Negative for: history of anesthetic complications  Airway Mallampati: I  TM Distance: >3 FB Neck ROM: Full    Dental  (+) Dental Advisory Given   Pulmonary COPD (nighttime O2: doesn't use presently),  COPD inhaler, Current Smoker and Patient abstained from smoking.,  12/19/2021 SARS coronavirus NEG   breath sounds clear to auscultation       Cardiovascular negative cardio ROS   Rhythm:Regular Rate:Normal  Echo 11/22 showed EF 55-60%, no significant valvular abnormalities   Neuro/Psych  Headaches, Anxiety Depression Bipolar Disorder    GI/Hepatic Neg liver ROS, GERD  Controlled and Medicated,  Endo/Other  Hypothyroidism   Renal/GU negative Renal ROS     Musculoskeletal  (+) Arthritis ,   Abdominal   Peds  Hematology negative hematology ROS (+)   Anesthesia Other Findings   Reproductive/Obstetrics                           Anesthesia Physical Anesthesia Plan  ASA: 3  Anesthesia Plan: General   Post-op Pain Management: Tylenol PO (pre-op)   Induction:   PONV Risk Score and Plan: 2 and Ondansetron and Dexamethasone  Airway Management Planned: Oral ETT and Video Laryngoscope Planned  Additional Equipment: None  Intra-op Plan:   Post-operative Plan: Extubation in OR  Informed Consent: I have reviewed the patients History and Physical, chart, labs and discussed the procedure including the risks, benefits and alternatives for the proposed anesthesia with the patient or authorized representative who has indicated his/her understanding and acceptance.     Dental advisory given  Plan Discussed with: CRNA and Surgeon  Anesthesia Plan Comments: (PAT note by Karoline Caldwell, PA-C: Recent cardiology eval by Dr. Otho Perl at [facility]. Treadmill exercise test 11/22 showed fair exercise capacity, no ischemia. Echo 11/22 showed EF 55-60%, no significant valvular abnormalities. She also wore a zio patch monitor, however results are not available in care everywhere.   Follows with pulmonology at [facility] for history of moderate COPD and nocturnal hypoxemia.  Is maintained on Stiolto and as needed albuterol.  She is prescribed 2 L O2 nightly.  She had acute COPD exacerbation 11/20/2021 treated by PCP with azithromycin and steroids.  CBC 12/19/2021 reviewed, unremarkable.  CMP 11/20/2021 in [place] reviewed, unremarkable.  EKG 08/02/2021 (Care Everywhere, tracing requested): Sinus rhythm.  Rate 85.  Possible LAE.  CHEST - 2 VIEW (Care Everywhere) 11/20/2021: COMPARISON: 10/12/2021  FINDINGS: Normal heart size, mediastinal contours, and pulmonary vascularity.  Atherosclerotic calcification aorta.  Lungs mildly hyperinflated with central peribronchial thickening.  No pulmonary infiltrate, pleural effusion, or pneumothorax.  Prior ORIF RIGHT clavicle.  Prior cervical spine fusion.  IMPRESSION: COPD changes without acute infiltrate.  TTE 10/04/21 (care everywhere): SUMMARY  Left ventricular systolic function is normal.  LV ejection fraction = 55-60%.  There is no significant valvular stenosis or regurgitation.  There is no comparison study available.   Exercise stress test 10/04/21 (care everywhere): 1. Exercise capacity was fair for age exercised for 4 minutes and 19 seconds of the 2-minute Bruce protocol achieving 7 METS.  2. The stress test was negative for ischemia with no diagnostic EKG changes at target heart rate.    )      Anesthesia Quick Evaluation

## 2021-12-20 ENCOUNTER — Ambulatory Visit (HOSPITAL_COMMUNITY): Payer: Medicare Other | Admitting: Certified Registered Nurse Anesthetist

## 2021-12-20 ENCOUNTER — Encounter (HOSPITAL_COMMUNITY): Payer: Self-pay | Admitting: Neurosurgery

## 2021-12-20 ENCOUNTER — Ambulatory Visit (HOSPITAL_COMMUNITY): Payer: Medicare Other | Admitting: Physician Assistant

## 2021-12-20 ENCOUNTER — Encounter (HOSPITAL_COMMUNITY)
Admission: AD | Disposition: A | Discharge status: Home / Self Care | Payer: Self-pay | Source: Home / Self Care | Attending: Neurosurgery

## 2021-12-20 ENCOUNTER — Ambulatory Visit (HOSPITAL_COMMUNITY): Payer: Medicare Other

## 2021-12-20 ENCOUNTER — Other Ambulatory Visit: Payer: Self-pay

## 2021-12-20 ENCOUNTER — Inpatient Hospital Stay (HOSPITAL_COMMUNITY)
Admission: AD | Admit: 2021-12-20 | Discharge: 2021-12-21 | DRG: 473 | Disposition: A | Discharge status: Home / Self Care | Payer: Medicare Other | Attending: Neurosurgery | Admitting: Neurosurgery

## 2021-12-20 DIAGNOSIS — F17200 Nicotine dependence, unspecified, uncomplicated: Secondary | ICD-10-CM | POA: Diagnosis present

## 2021-12-20 DIAGNOSIS — Z9981 Dependence on supplemental oxygen: Secondary | ICD-10-CM

## 2021-12-20 DIAGNOSIS — K219 Gastro-esophageal reflux disease without esophagitis: Secondary | ICD-10-CM | POA: Diagnosis present

## 2021-12-20 DIAGNOSIS — E039 Hypothyroidism, unspecified: Secondary | ICD-10-CM | POA: Diagnosis present

## 2021-12-20 DIAGNOSIS — Z79899 Other long term (current) drug therapy: Secondary | ICD-10-CM

## 2021-12-20 DIAGNOSIS — E785 Hyperlipidemia, unspecified: Secondary | ICD-10-CM | POA: Diagnosis present

## 2021-12-20 DIAGNOSIS — M960 Pseudarthrosis after fusion or arthrodesis: Principal | ICD-10-CM | POA: Diagnosis present

## 2021-12-20 DIAGNOSIS — S129XXA Fracture of neck, unspecified, initial encounter: Secondary | ICD-10-CM | POA: Diagnosis present

## 2021-12-20 DIAGNOSIS — Z20822 Contact with and (suspected) exposure to covid-19: Secondary | ICD-10-CM | POA: Diagnosis present

## 2021-12-20 DIAGNOSIS — J439 Emphysema, unspecified: Secondary | ICD-10-CM | POA: Diagnosis present

## 2021-12-20 DIAGNOSIS — Y848 Other medical procedures as the cause of abnormal reaction of the patient, or of later complication, without mention of misadventure at the time of the procedure: Secondary | ICD-10-CM | POA: Diagnosis present

## 2021-12-20 DIAGNOSIS — F319 Bipolar disorder, unspecified: Secondary | ICD-10-CM | POA: Diagnosis present

## 2021-12-20 DIAGNOSIS — Z419 Encounter for procedure for purposes other than remedying health state, unspecified: Secondary | ICD-10-CM

## 2021-12-20 HISTORY — PX: POSTERIOR CERVICAL FUSION/FORAMINOTOMY: SHX5038

## 2021-12-20 LAB — CBC
HCT: 45.5 % (ref 36.0–46.0)
Hemoglobin: 14.3 g/dL (ref 12.0–15.0)
MCH: 29.7 pg (ref 26.0–34.0)
MCHC: 31.4 g/dL (ref 30.0–36.0)
MCV: 94.6 fL (ref 80.0–100.0)
Platelets: 256 10*3/uL (ref 150–400)
RBC: 4.81 MIL/uL (ref 3.87–5.11)
RDW: 14.9 % (ref 11.5–15.5)
WBC: 6.5 10*3/uL (ref 4.0–10.5)
nRBC: 0 % (ref 0.0–0.2)

## 2021-12-20 LAB — CREATININE, SERUM
Creatinine, Ser: 0.68 mg/dL (ref 0.44–1.00)
GFR, Estimated: >60 mL/min (ref >60)

## 2021-12-20 SURGERY — POSTERIOR CERVICAL FUSION/FORAMINOTOMY LEVEL 1
Anesthesia: General

## 2021-12-20 MED ORDER — DIAZEPAM 5 MG PO TABS
10.0000 mg | ORAL_TABLET | Freq: Every day | ORAL | Status: DC | PRN
  Administered 2021-12-21: 10 mg via ORAL
  Filled 2021-12-20: qty 2

## 2021-12-20 MED ORDER — LUBIPROSTONE 24 MCG PO CAPS
24.0000 ug | ORAL_CAPSULE | Freq: Two times a day (BID) | ORAL | Status: DC
  Administered 2021-12-20 – 2021-12-21 (×3): 24 ug via ORAL
  Filled 2021-12-20 (×4): qty 1

## 2021-12-20 MED ORDER — ZOLPIDEM TARTRATE 5 MG PO TABS: 5.0000 mg | ORAL_TABLET | Freq: Every evening | ORAL | Status: DC | PRN

## 2021-12-20 MED ORDER — ALBUTEROL SULFATE (2.5 MG/3ML) 0.083% IN NEBU: 2.5000 mg | INHALATION_SOLUTION | Freq: Four times a day (QID) | RESPIRATORY_TRACT | Status: DC | PRN

## 2021-12-20 MED ORDER — CALCITRIOL 0.5 MCG PO CAPS
0.5000 ug | ORAL_CAPSULE | Freq: Every day | ORAL | Status: DC
  Administered 2021-12-20: 0.5 ug via ORAL
  Filled 2021-12-20 (×2): qty 1

## 2021-12-20 MED ORDER — OXYCODONE HCL ER 10 MG PO T12A
10.0000 mg | EXTENDED_RELEASE_TABLET | Freq: Two times a day (BID) | ORAL | Status: DC
  Administered 2021-12-20 – 2021-12-21 (×3): 10 mg via ORAL
  Filled 2021-12-20 (×3): qty 1

## 2021-12-20 MED ORDER — 0.9 % SODIUM CHLORIDE (POUR BTL) OPTIME
TOPICAL | Status: DC | PRN
  Administered 2021-12-20: 1000 mL

## 2021-12-20 MED ORDER — BACITRACIN ZINC 500 UNIT/GM EX OINT
TOPICAL_OINTMENT | CUTANEOUS | Status: DC | PRN
  Administered 2021-12-20: 1 via TOPICAL

## 2021-12-20 MED ORDER — LIDOCAINE 2% (20 MG/ML) 5 ML SYRINGE
INTRAMUSCULAR | Status: DC | PRN
  Administered 2021-12-20: 40 mg via INTRAVENOUS

## 2021-12-20 MED ORDER — LIDOCAINE-EPINEPHRINE 0.5 %-1:200000 IJ SOLN
INTRAMUSCULAR | Status: DC | PRN
  Administered 2021-12-20: 6 mL

## 2021-12-20 MED ORDER — HEPARIN SODIUM (PORCINE) 5000 UNIT/ML IJ SOLN
5000.0000 [IU] | Freq: Three times a day (TID) | INTRAMUSCULAR | Status: DC
  Administered 2021-12-21 (×2): 5000 [IU] via SUBCUTANEOUS
  Filled 2021-12-20 (×2): qty 1

## 2021-12-20 MED ORDER — LACTATED RINGERS IV SOLN: INTRAVENOUS | Status: DC

## 2021-12-20 MED ORDER — FENTANYL CITRATE (PF) 250 MCG/5ML IJ SOLN
INTRAMUSCULAR | Status: AC
  Filled 2021-12-20: qty 5

## 2021-12-20 MED ORDER — PHENYLEPHRINE 40 MCG/ML (10ML) SYRINGE FOR IV PUSH (FOR BLOOD PRESSURE SUPPORT)
PREFILLED_SYRINGE | INTRAVENOUS | Status: DC | PRN
  Administered 2021-12-20 (×4): 80 ug via INTRAVENOUS
  Administered 2021-12-20: 40 ug via INTRAVENOUS

## 2021-12-20 MED ORDER — VALACYCLOVIR HCL 500 MG PO TABS
1000.0000 mg | ORAL_TABLET | Freq: Every day | ORAL | Status: DC | PRN
  Filled 2021-12-20: qty 2

## 2021-12-20 MED ORDER — PHENYLEPHRINE HCL-NACL 20-0.9 MG/250ML-% IV SOLN
INTRAVENOUS | Status: DC | PRN
Start: 2021-12-20 — End: 2021-12-20
  Administered 2021-12-20: 25 ug/min via INTRAVENOUS

## 2021-12-20 MED ORDER — GABAPENTIN 400 MG PO CAPS
400.0000 mg | ORAL_CAPSULE | Freq: Three times a day (TID) | ORAL | Status: DC
  Administered 2021-12-20 – 2021-12-21 (×4): 400 mg via ORAL
  Filled 2021-12-20 (×4): qty 1

## 2021-12-20 MED ORDER — CEFAZOLIN SODIUM-DEXTROSE 2-4 GM/100ML-% IV SOLN
2.0000 g | INTRAVENOUS | Status: AC
  Administered 2021-12-20: 2 g via INTRAVENOUS
  Filled 2021-12-20: qty 100

## 2021-12-20 MED ORDER — ONDANSETRON HCL 4 MG/2ML IJ SOLN: 4.0000 mg | Freq: Four times a day (QID) | INTRAMUSCULAR | Status: DC | PRN

## 2021-12-20 MED ORDER — TIZANIDINE HCL 2 MG PO TABS
2.0000 mg | ORAL_TABLET | Freq: Three times a day (TID) | ORAL | Status: DC | PRN
  Administered 2021-12-20 – 2021-12-21 (×3): 2 mg via ORAL
  Filled 2021-12-20 (×4): qty 1

## 2021-12-20 MED ORDER — SODIUM CHLORIDE 0.9 % IV SOLN
250.0000 mL | INTRAVENOUS | Status: DC
  Administered 2021-12-20: 250 mL via INTRAVENOUS

## 2021-12-20 MED ORDER — SUGAMMADEX SODIUM 200 MG/2ML IV SOLN
INTRAVENOUS | Status: DC | PRN
Start: 2021-12-20 — End: 2021-12-20
  Administered 2021-12-20: 200 mg via INTRAVENOUS

## 2021-12-20 MED ORDER — MIDAZOLAM HCL 2 MG/2ML IJ SOLN
INTRAMUSCULAR | Status: DC | PRN
  Administered 2021-12-20: 2 mg via INTRAVENOUS

## 2021-12-20 MED ORDER — FENTANYL CITRATE (PF) 250 MCG/5ML IJ SOLN
INTRAMUSCULAR | Status: DC | PRN
  Administered 2021-12-20: 100 ug via INTRAVENOUS
  Administered 2021-12-20 (×2): 50 ug via INTRAVENOUS

## 2021-12-20 MED ORDER — ONDANSETRON HCL 4 MG/2ML IJ SOLN
INTRAMUSCULAR | Status: AC
  Filled 2021-12-20: qty 2

## 2021-12-20 MED ORDER — THROMBIN 20000 UNITS EX SOLR
CUTANEOUS | Status: AC
  Filled 2021-12-20: qty 20000

## 2021-12-20 MED ORDER — ACETAMINOPHEN 500 MG PO TABS
1000.0000 mg | ORAL_TABLET | Freq: Four times a day (QID) | ORAL | Status: AC
  Administered 2021-12-20 – 2021-12-21 (×3): 1000 mg via ORAL
  Filled 2021-12-20 (×3): qty 2

## 2021-12-20 MED ORDER — ARIPIPRAZOLE 10 MG PO TABS
20.0000 mg | ORAL_TABLET | Freq: Every day | ORAL | Status: DC
  Administered 2021-12-20: 20 mg via ORAL
  Filled 2021-12-20 (×2): qty 2

## 2021-12-20 MED ORDER — ESTRADIOL 1 MG PO TABS
1.0000 mg | ORAL_TABLET | Freq: Every day | ORAL | Status: DC
  Administered 2021-12-20 – 2021-12-21 (×2): 1 mg via ORAL
  Filled 2021-12-20 (×2): qty 1

## 2021-12-20 MED ORDER — OXYCODONE HCL 5 MG PO TABS: 5.0000 mg | ORAL_TABLET | Freq: Once | ORAL | Status: DC | PRN

## 2021-12-20 MED ORDER — HYDROMORPHONE HCL 1 MG/ML IJ SOLN
INTRAMUSCULAR | Status: AC
  Filled 2021-12-20: qty 1

## 2021-12-20 MED ORDER — PHENYLEPHRINE 40 MCG/ML (10ML) SYRINGE FOR IV PUSH (FOR BLOOD PRESSURE SUPPORT)
PREFILLED_SYRINGE | INTRAVENOUS | Status: AC
  Filled 2021-12-20: qty 10

## 2021-12-20 MED ORDER — MEPERIDINE HCL 25 MG/ML IJ SOLN: 6.2500 mg | INTRAMUSCULAR | Status: DC | PRN

## 2021-12-20 MED ORDER — PROPOFOL 10 MG/ML IV BOLUS
INTRAVENOUS | Status: DC | PRN
  Administered 2021-12-20: 50 mg via INTRAVENOUS
  Administered 2021-12-20: 100 mg via INTRAVENOUS

## 2021-12-20 MED ORDER — ARFORMOTEROL TARTRATE 15 MCG/2ML IN NEBU
15.0000 ug | INHALATION_SOLUTION | Freq: Two times a day (BID) | RESPIRATORY_TRACT | Status: DC
  Administered 2021-12-20: 15 ug via RESPIRATORY_TRACT
  Filled 2021-12-20 (×4): qty 2

## 2021-12-20 MED ORDER — ROCURONIUM BROMIDE 10 MG/ML (PF) SYRINGE
PREFILLED_SYRINGE | INTRAVENOUS | Status: DC | PRN
Start: 2021-12-20 — End: 2021-12-20
  Administered 2021-12-20: 20 mg via INTRAVENOUS
  Administered 2021-12-20: 60 mg via INTRAVENOUS

## 2021-12-20 MED ORDER — MIDAZOLAM HCL 2 MG/2ML IJ SOLN: 0.5000 mg | Freq: Once | INTRAMUSCULAR | Status: DC | PRN

## 2021-12-20 MED ORDER — SODIUM CHLORIDE 0.9% FLUSH: 3.0000 mL | INTRAVENOUS | Status: DC | PRN

## 2021-12-20 MED ORDER — CHLORHEXIDINE GLUCONATE CLOTH 2 % EX PADS: 6.0000 | MEDICATED_PAD | Freq: Once | CUTANEOUS | Status: DC

## 2021-12-20 MED ORDER — PROGESTERONE MICRONIZED 100 MG PO CAPS
100.0000 mg | ORAL_CAPSULE | Freq: Every day | ORAL | Status: DC
  Administered 2021-12-20: 100 mg via ORAL
  Filled 2021-12-20 (×2): qty 1

## 2021-12-20 MED ORDER — BACITRACIN ZINC 500 UNIT/GM EX OINT
TOPICAL_OINTMENT | CUTANEOUS | Status: AC
  Filled 2021-12-20: qty 28.35

## 2021-12-20 MED ORDER — HYDROMORPHONE HCL 1 MG/ML IJ SOLN
0.2500 mg | INTRAMUSCULAR | Status: DC | PRN
  Administered 2021-12-20 (×2): 0.5 mg via INTRAVENOUS

## 2021-12-20 MED ORDER — ACETAMINOPHEN 650 MG RE SUPP: 650.0000 mg | RECTAL | Status: DC | PRN

## 2021-12-20 MED ORDER — MECLIZINE HCL 12.5 MG PO TABS
12.5000 mg | ORAL_TABLET | Freq: Three times a day (TID) | ORAL | Status: DC | PRN
  Administered 2021-12-21: 12.5 mg via ORAL
  Filled 2021-12-20 (×2): qty 1

## 2021-12-20 MED ORDER — ARFORMOTEROL TARTRATE 15 MCG/2ML IN NEBU
15.0000 ug | INHALATION_SOLUTION | Freq: Two times a day (BID) | RESPIRATORY_TRACT | Status: DC
  Filled 2021-12-20: qty 2

## 2021-12-20 MED ORDER — BUDESONIDE 0.5 MG/2ML IN SUSP
0.5000 mg | Freq: Two times a day (BID) | RESPIRATORY_TRACT | Status: DC | PRN
  Filled 2021-12-20: qty 2

## 2021-12-20 MED ORDER — ONDANSETRON HCL 4 MG/2ML IJ SOLN
INTRAMUSCULAR | Status: DC | PRN
  Administered 2021-12-20: 4 mg via INTRAVENOUS

## 2021-12-20 MED ORDER — ADULT MULTIVITAMIN W/MINERALS CH
1.0000 | ORAL_TABLET | Freq: Every day | ORAL | Status: DC
  Administered 2021-12-21: 1 via ORAL
  Filled 2021-12-20: qty 1

## 2021-12-20 MED ORDER — CHLORHEXIDINE GLUCONATE 0.12 % MT SOLN
15.0000 mL | Freq: Once | OROMUCOSAL | Status: AC
  Administered 2021-12-20: 15 mL via OROMUCOSAL
  Filled 2021-12-20: qty 15

## 2021-12-20 MED ORDER — FLUTICASONE PROPIONATE 50 MCG/ACT NA SUSP: 2.0000 | Freq: Every day | NASAL | Status: DC | PRN

## 2021-12-20 MED ORDER — THROMBIN 20000 UNITS EX SOLR
CUTANEOUS | Status: DC | PRN
  Administered 2021-12-20: 20 mL via TOPICAL

## 2021-12-20 MED ORDER — BUPIVACAINE HCL (PF) 0.5 % IJ SOLN
INTRAMUSCULAR | Status: DC | PRN
Start: 2021-12-20 — End: 2021-12-20
  Administered 2021-12-20: 16 mL

## 2021-12-20 MED ORDER — ORAL CARE MOUTH RINSE: 15.0000 mL | Freq: Once | OROMUCOSAL | Status: AC

## 2021-12-20 MED ORDER — ACETAMINOPHEN 500 MG PO TABS
1000.0000 mg | ORAL_TABLET | Freq: Once | ORAL | Status: AC
  Administered 2021-12-20: 1000 mg via ORAL
  Filled 2021-12-20: qty 2

## 2021-12-20 MED ORDER — PROPOFOL 10 MG/ML IV BOLUS
INTRAVENOUS | Status: AC
  Filled 2021-12-20: qty 20

## 2021-12-20 MED ORDER — DEXAMETHASONE SODIUM PHOSPHATE 10 MG/ML IJ SOLN
INTRAMUSCULAR | Status: DC | PRN
  Administered 2021-12-20: 10 mg via INTRAVENOUS

## 2021-12-20 MED ORDER — OXYCODONE HCL 5 MG/5ML PO SOLN: 5.0000 mg | Freq: Once | ORAL | Status: DC | PRN

## 2021-12-20 MED ORDER — MENTHOL 3 MG MT LOZG: 1.0000 | LOZENGE | OROMUCOSAL | Status: DC | PRN

## 2021-12-20 MED ORDER — OXYCODONE HCL 5 MG PO TABS
10.0000 mg | ORAL_TABLET | ORAL | Status: DC | PRN
  Administered 2021-12-20 – 2021-12-21 (×6): 10 mg via ORAL
  Filled 2021-12-20 (×6): qty 2

## 2021-12-20 MED ORDER — NYSTATIN 100000 UNIT/ML MT SUSP
2.5000 mL | Freq: Three times a day (TID) | OROMUCOSAL | Status: DC
  Administered 2021-12-20 – 2021-12-21 (×2): 250000 [IU] via ORAL
  Filled 2021-12-20 (×5): qty 5

## 2021-12-20 MED ORDER — LEVOTHYROXINE SODIUM 88 MCG PO TABS
88.0000 ug | ORAL_TABLET | Freq: Every day | ORAL | Status: DC
  Administered 2021-12-21: 88 ug via ORAL
  Filled 2021-12-20 (×2): qty 1

## 2021-12-20 MED ORDER — DEXAMETHASONE SODIUM PHOSPHATE 10 MG/ML IJ SOLN
INTRAMUSCULAR | Status: AC
  Filled 2021-12-20: qty 1

## 2021-12-20 MED ORDER — POTASSIUM CHLORIDE IN NACL 20-0.9 MEQ/L-% IV SOLN: INTRAVENOUS | Status: DC

## 2021-12-20 MED ORDER — OXYCODONE HCL 5 MG PO TABS: 5.0000 mg | ORAL_TABLET | ORAL | Status: DC | PRN

## 2021-12-20 MED ORDER — PROMETHAZINE HCL 25 MG/ML IJ SOLN: 6.2500 mg | INTRAMUSCULAR | Status: DC | PRN

## 2021-12-20 MED ORDER — UMECLIDINIUM BROMIDE 62.5 MCG/ACT IN AEPB
1.0000 | INHALATION_SPRAY | Freq: Every day | RESPIRATORY_TRACT | Status: DC
  Filled 2021-12-20: qty 7

## 2021-12-20 MED ORDER — HEMOSTATIC AGENTS (NO CHARGE) OPTIME
TOPICAL | Status: DC | PRN
  Administered 2021-12-20: 1 via TOPICAL

## 2021-12-20 MED ORDER — PANTOPRAZOLE SODIUM 40 MG PO TBEC
40.0000 mg | DELAYED_RELEASE_TABLET | Freq: Every day | ORAL | Status: DC
  Administered 2021-12-21: 40 mg via ORAL
  Filled 2021-12-20: qty 1

## 2021-12-20 MED ORDER — ROCURONIUM BROMIDE 10 MG/ML (PF) SYRINGE
PREFILLED_SYRINGE | INTRAVENOUS | Status: AC
  Filled 2021-12-20: qty 10

## 2021-12-20 MED ORDER — ALBUTEROL SULFATE HFA 108 (90 BASE) MCG/ACT IN AERS: 2.0000 | INHALATION_SPRAY | Freq: Four times a day (QID) | RESPIRATORY_TRACT | Status: DC | PRN

## 2021-12-20 MED ORDER — LIDOCAINE 2% (20 MG/ML) 5 ML SYRINGE
INTRAMUSCULAR | Status: AC
  Filled 2021-12-20: qty 5

## 2021-12-20 MED ORDER — BUPIVACAINE HCL (PF) 0.5 % IJ SOLN
INTRAMUSCULAR | Status: AC
  Filled 2021-12-20: qty 30

## 2021-12-20 MED ORDER — SODIUM CHLORIDE 0.9% FLUSH
3.0000 mL | Freq: Two times a day (BID) | INTRAVENOUS | Status: DC
  Administered 2021-12-20: 3 mL via INTRAVENOUS

## 2021-12-20 MED ORDER — LIDOCAINE-EPINEPHRINE 1 %-1:100000 IJ SOLN
INTRAMUSCULAR | Status: AC
  Filled 2021-12-20: qty 1

## 2021-12-20 MED ORDER — MIDAZOLAM HCL 2 MG/2ML IJ SOLN
INTRAMUSCULAR | Status: AC
  Filled 2021-12-20: qty 2

## 2021-12-20 MED ORDER — ONDANSETRON HCL 4 MG PO TABS: 4.0000 mg | ORAL_TABLET | Freq: Four times a day (QID) | ORAL | Status: DC | PRN

## 2021-12-20 MED ORDER — ACETAMINOPHEN 325 MG PO TABS: 650.0000 mg | ORAL_TABLET | ORAL | Status: DC | PRN

## 2021-12-20 MED ORDER — ATORVASTATIN CALCIUM 10 MG PO TABS
10.0000 mg | ORAL_TABLET | Freq: Every day | ORAL | Status: DC
  Administered 2021-12-20: 10 mg via ORAL
  Filled 2021-12-20: qty 1

## 2021-12-20 MED ORDER — PHENOL 1.4 % MT LIQD: 1.0000 | OROMUCOSAL | Status: DC | PRN

## 2021-12-20 SURGICAL SUPPLY — 62 items
ADH SKN CLS APL DERMABOND .7 (GAUZE/BANDAGES/DRESSINGS) ×1
APL SKNCLS STERI-STRIP NONHPOA (GAUZE/BANDAGES/DRESSINGS) ×1
BAG COUNTER SPONGE SURGICOUNT (BAG) ×3 IMPLANT
BAG SPNG CNTER NS LX DISP (BAG) ×2
BENZOIN TINCTURE PRP APPL 2/3 (GAUZE/BANDAGES/DRESSINGS) ×3 IMPLANT
BIT DRILL FOR 3.5 SCREW (DRILL) IMPLANT
BLADE CLIPPER SURG (BLADE) ×2 IMPLANT
BLADE ULTRA TIP 2M (BLADE) IMPLANT
BUR MATCHSTICK NEURO 3.0 LAGG (BURR) ×2 IMPLANT
CANISTER SUCT 3000ML PPV (MISCELLANEOUS) ×2 IMPLANT
CAP LOCKING (Cap) IMPLANT
CARTRIDGE OIL MAESTRO DRILL (MISCELLANEOUS) ×1 IMPLANT
DECANTER SPIKE VIAL GLASS SM (MISCELLANEOUS) ×2 IMPLANT
DERMABOND ADVANCED (GAUZE/BANDAGES/DRESSINGS) ×1
DERMABOND ADVANCED .7 DNX12 (GAUZE/BANDAGES/DRESSINGS) IMPLANT
DIFFUSER DRILL AIR PNEUMATIC (MISCELLANEOUS) ×2 IMPLANT
DRAPE C-ARM 42X72 X-RAY (DRAPES) ×5 IMPLANT
DRAPE LAPAROTOMY 100X72 PEDS (DRAPES) ×2 IMPLANT
DRILL FOR 3.5 SCREW (DRILL) ×2
DRSG OPSITE POSTOP 3X4 (GAUZE/BANDAGES/DRESSINGS) ×1 IMPLANT
DURAPREP 6ML APPLICATOR 50/CS (WOUND CARE) ×2 IMPLANT
ELECT REM PT RETURN 9FT ADLT (ELECTROSURGICAL) ×2
ELECTRODE REM PT RTRN 9FT ADLT (ELECTROSURGICAL) ×1 IMPLANT
GAUZE 4X4 16PLY ~~LOC~~+RFID DBL (SPONGE) ×1 IMPLANT
GAUZE SPONGE 4X4 12PLY STRL (GAUZE/BANDAGES/DRESSINGS) IMPLANT
GLOVE EXAM NITRILE XL STR (GLOVE) IMPLANT
GLOVE SURG LTX SZ6.5 (GLOVE) ×2 IMPLANT
GOWN STRL REUS W/ TWL LRG LVL3 (GOWN DISPOSABLE) IMPLANT
GOWN STRL REUS W/ TWL XL LVL3 (GOWN DISPOSABLE) ×1 IMPLANT
GOWN STRL REUS W/TWL 2XL LVL3 (GOWN DISPOSABLE) ×2 IMPLANT
GOWN STRL REUS W/TWL LRG LVL3 (GOWN DISPOSABLE) ×2
GOWN STRL REUS W/TWL XL LVL3 (GOWN DISPOSABLE) ×2
GRAFT DURAGEN MATRIX 1WX1L (Tissue) ×1 IMPLANT
GRAFT TRIN ELITE MED MUSC TRAN (Graft) ×1 IMPLANT
IMPL QUARTEX 3.5X12MM (Neuro Prosthesis/Implant) IMPLANT
IMPLANT QUARTEX 3.5X12MM (Neuro Prosthesis/Implant) ×8 IMPLANT
KIT BASIN OR (CUSTOM PROCEDURE TRAY) ×2 IMPLANT
KIT TURNOVER KIT B (KITS) ×2 IMPLANT
LOCKING CAP (Cap) ×8 IMPLANT
NDL HYPO 25X1 1.5 SAFETY (NEEDLE) ×1 IMPLANT
NEEDLE HYPO 25X1 1.5 SAFETY (NEEDLE) ×2 IMPLANT
NS IRRIG 1000ML POUR BTL (IV SOLUTION) ×3 IMPLANT
OIL CARTRIDGE MAESTRO DRILL (MISCELLANEOUS) ×2
PACK LAMINECTOMY NEURO (CUSTOM PROCEDURE TRAY) ×2 IMPLANT
PAD ARMBOARD 7.5X6 YLW CONV (MISCELLANEOUS) ×3 IMPLANT
PIN MAYFIELD SKULL DISP (PIN) ×2 IMPLANT
ROD SPINAL 3.5D 25L CVD (Rod) ×2 IMPLANT
SEALANT ADHERUS EXTEND TIP (MISCELLANEOUS) ×1 IMPLANT
SPONGE SURGIFOAM ABS GEL 100 (HEMOSTASIS) ×2 IMPLANT
SPONGE T-LAP 4X18 ~~LOC~~+RFID (SPONGE) ×1 IMPLANT
STAPLER SKIN PROX WIDE 3.9 (STAPLE) ×2 IMPLANT
STRIP CLOSURE SKIN 1/2X4 (GAUZE/BANDAGES/DRESSINGS) ×1 IMPLANT
SUT ETHILON 3 0 FSL (SUTURE) IMPLANT
SUT VIC AB 0 CT1 18XCR BRD8 (SUTURE) ×1 IMPLANT
SUT VIC AB 0 CT1 8-18 (SUTURE) ×2
SUT VIC AB 2-0 CT1 18 (SUTURE) ×3 IMPLANT
SUT VIC AB 3-0 SH 8-18 (SUTURE) ×3 IMPLANT
TAPE CLOTH 3X10 TAN LF (GAUZE/BANDAGES/DRESSINGS) ×2 IMPLANT
TOWEL GREEN STERILE (TOWEL DISPOSABLE) ×2 IMPLANT
TOWEL GREEN STERILE FF (TOWEL DISPOSABLE) ×2 IMPLANT
UNDERPAD 30X36 HEAVY ABSORB (UNDERPADS AND DIAPERS) ×1 IMPLANT
WATER STERILE IRR 1000ML POUR (IV SOLUTION) ×2 IMPLANT

## 2021-12-20 NOTE — Anesthesia Postprocedure Evaluation (Signed)
Anesthesia Post Note  Patient: Teresa Chaney  Procedure(s) Performed: Cervical four-five Posterior cervical fusion with lateral mass fixation     Patient location during evaluation: PACU Anesthesia Type: General Level of consciousness: sedated, patient cooperative and oriented Pain management: pain level controlled Vital Signs Assessment: post-procedure vital signs reviewed and stable Respiratory status: spontaneous breathing, nonlabored ventilation, respiratory function stable and patient connected to nasal cannula oxygen Cardiovascular status: blood pressure returned to baseline and stable Postop Assessment: no apparent nausea or vomiting Anesthetic complications: no   No notable events documented.  Last Vitals:  Vitals:   12/20/21 1510 12/20/21 1511  BP: (!) 120/91   Pulse: 94 60  Resp: 20   Temp: 36.6 C   SpO2: 95% 96%    Last Pain:  Vitals:   12/20/21 1435  TempSrc:   PainSc: Asleep                 Breyon Blass,E. Annalynn Centanni

## 2021-12-20 NOTE — H&P (Signed)
BP 106/79    Pulse 82    Temp 98.2 F (36.8 C) (Oral)    Resp 18    Ht 5\' 3"  (1.6 m)    Wt 53.5 kg    LMP 01/11/2014    SpO2 98%    BMI 20.90 kg/m     Teresa Chaney is returns today after a myelogram post myelogram CT of the cervical spine.  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  She says that she is having migraines, she is having a lot of headaches, and she felt that they were stemming from the pain that she has in her neck.  She describes the neck pain as something that is constant.  It is worse at times, but it is never not there.  It keeps her from being able to sleep at night.  She has been treated for migraines at her [facility].  She also had an MRI of the lumbar spine performed in October, ordered by her Pain Clinic.       She is 5 feet 3 inches, weighs 119 pounds, temperature is 98.5 degrees Fahrenheit, blood pressure 111/72, pulse 106, respiratory rate 20, pain is 7/10.  On exam, she is alert, oriented by 4.  She answers all questions appropriately.  Memory, language, attention span, and fund of knowledge normal.  Speech is clear, it is also fluent.  She has normal gait, normal muscle tone, bulk, and coordination.  Reflexes 2+ biceps, triceps, brachioradialis, knees, trace at the ankles.  Left ankle is more brisk than the right.     What the myelogram showed is that she does have a pseudoarthrosis at 4-5.  The plate is in position.  The screws are not pulled out at all.  Spinal canal is widely patent, and there is no impingement on the nerve roots at that level, but the pseudoarthrosis is there.  Now when she 1st describes it, she will say that the neck pain is the worst. If you ask her, "Do your arms hurt", then she says "yes, both of my arms hurt".  If that is stemming from the neck, I'm not sure where or why there is no neural compression of any region of the nerves that comprise the brachial plexus.  I just don't see an etiology, does not mean that 1 is not there.  I doubt that all of this is  stemming from a 4-5 level, which would limit the pain somewhere around the shoulder, but she says that the pain extends into both upper extremities, present all the time.  It is hard to move her neck because of pain. It is hard to have her neck because it hurts.  She also has a lot of pain in the lower back.  An MRI done in October, as previously mentioned, did not show any disc herniation.  She has a small little disc bulge at 5-1, which is not impinging the nerve root.  She does not have anything of great significance besides the degeneration at the 5-1 space, and very few Modic changes, which are present on the MRI, but the spinal canal is patent.  Conus is normal. Cauda equina is otherwise normal. No paraspinous soft tissue masses or the like.     I discussed with Teresa Chaney the fact that while she has a pseudoarthrosis and this may be causing some of the neck pain, it is then worthwhile for [place] to go ahead and try a posterior spinal fusion at C4-5.  I would  proceed with a posterior arthrodesis there and we believe that the pseudoarthrosis may be a big reason as to why she still has so much neck pain.  I do not believe that will help with the radicular pain and I explained that to her.  I also do not believe that this is going to help with her headaches and I explained that also.  They may get better.  She says they got better before when we did the cervical operation. If that is so, I would certainly welcome the improvement as she would feel better, but I am certainly not going to plan this operation with the notion that her cervical spine will be improved.  I will see her this coming Friday.  Risks and benefits, bleeding, infection, no relief, damage to the spinal cord, paralysis, weakness in 1 or both upper extremities and 1 or both lower extremities, bowel and bladder dysfunction were discussed.  Pseudoarthrosis again was discussed.  She understands and wishes to proceed.

## 2021-12-20 NOTE — Transfer of Care (Signed)
Immediate Anesthesia Transfer of Care Note  Patient: Teresa Chaney  Procedure(s) Performed: Cervical four-five Posterior cervical fusion with lateral mass fixation  Patient Location: PACU  Anesthesia Type:General  Level of Consciousness: drowsy  Airway & Oxygen Therapy: Patient Spontanous Breathing and Patient connected to face mask oxygen  Post-op Assessment: Report given to RN and Post -op Vital signs reviewed and stable  Post vital signs: Reviewed and stable  Last Vitals:  Vitals Value Taken Time  BP 111/81 12/20/21 1335  Temp    Pulse 97 12/20/21 1338  Resp 17 12/20/21 1338  SpO2 95 % 12/20/21 1338  Vitals shown include unvalidated device data.  Last Pain:  Vitals:   12/20/21 0832  TempSrc:   PainSc: 7       Patients Stated Pain Goal: 0 (42/55/25 8948)  Complications: No notable events documented.

## 2021-12-20 NOTE — Anesthesia Procedure Notes (Signed)
Procedure Name: Intubation Date/Time: 12/20/2021 11:28 AM Performed by: Carolan Clines, CRNA Pre-anesthesia Checklist: Patient identified, Emergency Drugs available, Suction available and Patient being monitored Patient Re-evaluated:Patient Re-evaluated prior to induction Oxygen Delivery Method: Circle System Utilized Preoxygenation: Pre-oxygenation with 100% oxygen Induction Type: IV induction Ventilation: Mask ventilation without difficulty Laryngoscope Size: Glidescope and 3 Grade View: Grade I Tube type: Oral Tube size: 7.0 mm Number of attempts: 1 Airway Equipment and Method: Oral airway, Rigid stylet and Video-laryngoscopy Placement Confirmation: ETT inserted through vocal cords under direct vision, positive ETCO2 and breath sounds checked- equal and bilateral Secured at: 21 cm Tube secured with: Tape Dental Injury: Teeth and Oropharynx as per pre-operative assessment  Difficulty Due To: Difficulty was anticipated and Difficult Airway- due to reduced neck mobility Comments: Elective glidescope intubation.

## 2021-12-20 NOTE — Op Note (Signed)
12/20/2021  2:14 PM  PATIENT:  Teresa Chaney  56 y.o. female With cervical pain, and a psuedoarthrosis at C4/5. 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 She had a Mayfield head holder applied to 60lbs of pressure. She was positioned prone on the operating room table with her head attached to the Mayfield adapter. Her neck was shaved, was prepped and was draped in a sterile manner.  I injected lidocaine into the planned incision. I used fluoroscopy to plan the incision. I opened the skin with a 10 blade, and dissected sharply through the skin. I used the monopolar cautery to expose the lamina of C4, and 5. I confirmed my location with fluoroscopy. I exposed the lateral masses of C4 and 5 bilaterally. I used an awl, then the drill to place the screws(globus). I connected the screws with rods, and secured the construct with locking caps.  I completed the arthrodesis by decorticated the C4/5 facet then placing autograft morsels over the decorticated bone.  I injected marcaine into the paraspinous musculature then closed. I approximated the deep fascia, subcutaneous, and subcuticular layers with suture. I dressed the wound with Dermabond and an occlusive bandage.  I did have csf which started when I was exposing the lamina. I placed duragen and a dural  sealant to close the opening.  I removed the Mayfield from the adapter, positioned her supine on the bed and removed the head holder.   PLAN OF CARE: Admit for overnight observation  PATIENT DISPOSITION:  PACU - hemodynamically stable.   Delay start of Pharmacological VTE agent (>24hrs) due to surgical blood loss or risk of bleeding:  no

## 2021-12-21 DIAGNOSIS — Z9981 Dependence on supplemental oxygen: Secondary | ICD-10-CM | POA: Diagnosis not present

## 2021-12-21 DIAGNOSIS — J439 Emphysema, unspecified: Secondary | ICD-10-CM | POA: Diagnosis present

## 2021-12-21 DIAGNOSIS — M960 Pseudarthrosis after fusion or arthrodesis: Secondary | ICD-10-CM | POA: Diagnosis present

## 2021-12-21 DIAGNOSIS — Y848 Other medical procedures as the cause of abnormal reaction of the patient, or of later complication, without mention of misadventure at the time of the procedure: Secondary | ICD-10-CM | POA: Diagnosis present

## 2021-12-21 DIAGNOSIS — K219 Gastro-esophageal reflux disease without esophagitis: Secondary | ICD-10-CM | POA: Diagnosis present

## 2021-12-21 DIAGNOSIS — Z20822 Contact with and (suspected) exposure to covid-19: Secondary | ICD-10-CM | POA: Diagnosis present

## 2021-12-21 DIAGNOSIS — E039 Hypothyroidism, unspecified: Secondary | ICD-10-CM | POA: Diagnosis present

## 2021-12-21 DIAGNOSIS — Z79899 Other long term (current) drug therapy: Secondary | ICD-10-CM | POA: Diagnosis not present

## 2021-12-21 DIAGNOSIS — E785 Hyperlipidemia, unspecified: Secondary | ICD-10-CM | POA: Diagnosis present

## 2021-12-21 DIAGNOSIS — F319 Bipolar disorder, unspecified: Secondary | ICD-10-CM | POA: Diagnosis present

## 2021-12-21 DIAGNOSIS — F17200 Nicotine dependence, unspecified, uncomplicated: Secondary | ICD-10-CM | POA: Diagnosis present

## 2021-12-21 NOTE — Discharge Instructions (Addendum)
° ° ° ° ° ° ° ° ° ° °  Wound Care Remove outer dressing in 2 -3 days Leave incision open to air. You may shower. Do not scrub directly on incision.  Do not put any creams, lotions, or ointments on incision. Activity Walk each and every day, increasing distance each day. No lifting greater than 5 lbs.  Avoid excessive neck motion. No driving for 2 weeks; may ride as a passenger locally.  Diet Resume your normal diet.   Call Your Doctor If Any of These Occur Redness, drainage, or swelling at the wound.  Temperature greater than 101 degrees. Severe pain not relieved by pain medication. Increased difficulty swallowing. Incision starts to come apart. Follow Up Appt Call today for appointment in 3 weeks ([phone]) or for problems.  If you have any hardware placed in your spine, you will need an x-ray before your appointment.              Spinal Fusion Care After Refer to this sheet in the next few weeks. These instructions provide you with information on caring for yourself after your procedure. Your caregiver may also give you more specific instructions. Your treatment has been planned according to current medical practices, but problems sometimes occur. Call your caregiver if you have any problems or questions after your procedure. HOME CARE INSTRUCTIONS  Take whatever pain medicine has been prescribed by your caregiver. Do not take over-the-counter pain medicine unless directed otherwise by your caregiver.  Do not drive if you are taking narcotic pain medicines.  Change your bandage (dressing) if necessary or as directed by your caregiver.  You may shower. The wound may get wet, simply pat the area dry. It will take ~2 weeks for the glue to peel off. If you have been prescribed medicine to prevent your blood from clotting, follow the directions carefully.  Check the area around your incision often. Look for redness and swelling. Also, look for anything leaking from your wound.  You can use a mirror or have a family member inspect your incision if it is in a place where it is difficult for you to see.  Ask your caregiver what activities you should avoid and for how long.  Walk as much as possible.  Do not lift anything heavier than 5 lbs until your caregiver says it is safe.  Do not twist or bend for a few weeks. Try not to pull on things. Avoid sitting for long periods of time. Change positions at least every hour.

## 2021-12-21 NOTE — Progress Notes (Signed)
Patient still in room waiting for her daughter for ride home. Discharge instructions given to patient with stated understanding of instructions given. Pain medication also given prior to discharge for pain and discomfort of ride home.

## 2021-12-21 NOTE — Evaluation (Signed)
Occupational Therapy Evaluation Patient Details Name: Teresa Chaney MRN: [id number] DOB: May 03, 1966 Today's Date: 12/21/2021   History of Present Illness Pt is a 56 y/o female admitted 1/18 s/p C4-5 posterior cervical fusion with lateral mass fixation. PMH inculdes: bipolar, anxiety, arthritis, COPD, emphysema lung, hip fx, neuromuscular disorder.   Clinical Impression   PTA pt independent and driving. Admitted for above and educated on cervical precautions, ADL compensatory techniques, recommendations and safety.  Pt will have support of daughter and brother to assist at dc.  She is able to complete ADLs, transfers and mobility with modified independence after education of techniques.  Min cueing for adherence of cervical precautions functionally.  No further acute OT needs identified and OT will sign off.  Thank you for this referral!      Recommendations for follow up therapy are one component of a multi-disciplinary discharge planning process, led by the attending physician.  Recommendations may be updated based on patient status, additional functional criteria and insurance authorization.   Follow Up Recommendations  No OT follow up    Assistance Recommended at Discharge Intermittent Supervision/Assistance  Patient can return home with the following A little help with bathing/dressing/bathroom;Assistance with cooking/housework;Assist for transportation    Functional Status Assessment     Equipment Recommendations  None recommended by OT    Recommendations for Other Services       Precautions / Restrictions Precautions Precautions: Cervical Precaution Booklet Issued: Yes (comment) Precaution Comments: reviewed with pt, good recall but min cueing to adhere funcitonally Required Braces or Orthoses:  (no brace needed) Restrictions Weight Bearing Restrictions: No      Mobility Bed Mobility Overal bed mobility: Modified Independent             General bed mobility  comments: HOB flat, able to complete after educated on technique    Transfers                          Balance Overall balance assessment: No apparent balance deficits (not formally assessed)                                         ADL either performed or assessed with clinical judgement   ADL Overall ADL's : Modified independent                                       General ADL Comments: reviewed ADL compensatory techniques, recommendations and safety.  Pt reports plan to shower with support of her daughter present for increased safety.     Vision   Vision Assessment?: No apparent visual deficits     Perception     Praxis      Pertinent Vitals/Pain Pain Assessment Pain Assessment: Faces Faces Pain Scale: Hurts little more Pain Location: neck, incisional Pain Descriptors / Indicators: Discomfort, Operative site guarding Pain Intervention(s): Monitored during session, Limited activity within patient's tolerance, Repositioned     Hand Dominance     Extremity/Trunk Assessment Upper Extremity Assessment Upper Extremity Assessment: Overall WFL for tasks assessed   Lower Extremity Assessment Lower Extremity Assessment: Overall WFL for tasks assessed   Cervical / Trunk Assessment Cervical / Trunk Assessment: Neck Surgery   Communication Communication Communication: No difficulties   Cognition Arousal/Alertness: Awake/alert Behavior During Therapy:  WFL for tasks assessed/performed Overall Cognitive Status: Within Functional Limits for tasks assessed                                       General Comments       Exercises     Shoulder Instructions      Home Living Family/patient expects to be discharged to:: Private residence Living Arrangements: Children;Other (Comment) (brother) Available Help at Discharge: Family;Available 24 hours/day Type of Home: Apartment Home Access: Stairs to enter Entrance  Stairs-Number of Steps: 3 Entrance Stairs-Rails: Can reach both Home Layout: One level     Bathroom Shower/Tub: Teacher, early years/pre: Standard     Home Equipment: None          Prior Functioning/Environment Prior Level of Function : Independent/Modified Independent;Driving                        OT Problem List: Decreased activity tolerance;Decreased safety awareness;Decreased knowledge of use of DME or AE;Decreased knowledge of precautions;Pain      OT Treatment/Interventions:      OT Goals(Current goals can be found in the care plan section) Acute Rehab OT Goals Patient Stated Goal: home, less pain OT Goal Formulation: With patient  OT Frequency:      Co-evaluation              AM-PAC OT "6 Clicks" Daily Activity     Outcome Measure Help from another person eating meals?: None Help from another person taking care of personal grooming?: None Help from another person toileting, which includes using toliet, bedpan, or urinal?: None Help from another person bathing (including washing, rinsing, drying)?: None Help from another person to put on and taking off regular upper body clothing?: None Help from another person to put on and taking off regular lower body clothing?: None 6 Click Score: 24   End of Session Nurse Communication: Mobility status  Activity Tolerance: Patient tolerated treatment well Patient left: in bed;with call bell/phone within reach  OT Visit Diagnosis: Other abnormalities of gait and mobility (R26.89);Pain Pain - part of body:  (neck)                Time: 5956-3875 OT Time Calculation (min): 21 min Charges:  OT General Charges $OT Visit: 1 Visit OT Evaluation $OT Eval Low Complexity: 1 Low  Jolaine Artist, OT Acute Rehabilitation Services Pager [phone] Office [phone]   Delight Stare 12/21/2021, 9:36 AM

## 2021-12-22 ENCOUNTER — Encounter (HOSPITAL_COMMUNITY): Payer: Self-pay | Admitting: Neurosurgery

## 2021-12-22 NOTE — Discharge Summary (Signed)
Physician Discharge Summary  Patient ID: Teresa Chaney MRN: [id number] DOB/AGE: 05/08/1966 56 y.o.  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 Post op she is moving all extremities, ambulating, voiding, and tolerating a regular diet.  Her wound dressing is clean, and dry. There are no signs of infection.   Treatments: surgery: PSF C4,5   Discharge Exam: Blood pressure 125/71, pulse (!) 107, temperature 99.7 F (37.6 C), temperature source Oral, resp. rate 18, height 5\' 3"  (1.6 m), weight 53.5 kg, last menstrual period 01/11/2014, SpO2 (!) 88 %. General appearance: alert, cooperative, appears stated age, and no distress  Disposition: Discharge disposition: 01-Home or Self Care      pseudoarthrosis of cervical spine  Allergies as of 12/21/2021       Reactions   Aspirin Nausea And Vomiting   Baclofen    Worsens migraines    Codeine Hives, Itching   Pregabalin Swelling   Other reaction(s): Tremor (intolerance)   Soma [carisoprodol] Nausea And Vomiting   Blacked out   Tape Hives, Itching   Tussin [guaifenesin] Hives, Itching   Sertraline Anxiety   Agitation         Medication List     STOP taking these medications    naproxen sodium 220 MG tablet Commonly known as: ALEVE       TAKE these medications    acetaminophen 500 MG tablet Commonly known as: TYLENOL Take 1,500 mg by mouth 3 (three) times daily as needed for moderate pain.   albuterol 108 (90 Base) MCG/ACT inhaler Commonly known as: VENTOLIN HFA Inhale 2 puffs into the lungs every 6 (six) hours as needed for wheezing or shortness of breath.   albuterol (2.5 MG/3ML) 0.083%  nebulizer solution Commonly known as: PROVENTIL Take 2.5 mg by nebulization every 6 (six) hours as needed for wheezing or shortness of breath.   ARIPiprazole 20 MG tablet Commonly known as: ABILIFY Take 20 mg by mouth daily.   atorvastatin 10 MG tablet Commonly known as: LIPITOR Take 10 mg by mouth at bedtime.   budesonide 0.5 MG/2ML nebulizer solution Commonly known as: PULMICORT Take 0.5 mg by nebulization 2 (two) times daily as needed (shortness of breath).   calcitRIOL 0.5 MCG capsule Commonly known as: ROCALTROL Take 0.5 mcg by mouth daily.   dexlansoprazole 60 MG capsule Commonly known as: DEXILANT Take 60 mg by mouth daily.   diazepam 10 MG tablet Commonly known as: VALIUM Take 10 mg by mouth daily as needed for anxiety.   estradiol 1 MG tablet Commonly known as: ESTRACE Take 1 mg by mouth daily.   fluticasone 50 MCG/ACT nasal spray Commonly known as: FLONASE Place 2 sprays into both nostrils daily as needed for allergies or rhinitis.   gabapentin 400 MG capsule Commonly known as: NEURONTIN Take 400 mg by mouth 3 (three) times daily.   levothyroxine 88 MCG tablet Commonly known as: SYNTHROID Take 88 mcg by mouth daily before breakfast.   lubiprostone 24 MCG capsule Commonly known as: AMITIZA Take 24 mcg by mouth 2 (two) times daily with a meal.   meclizine 12.5 MG tablet Commonly known as: ANTIVERT Take 12.5 mg by mouth 3 (three) times daily as needed for dizziness.   methocarbamol 500 MG  tablet Commonly known as: ROBAXIN Take 500 mg by mouth 3 (three) times daily.   multivitamin with minerals tablet Take 1 tablet by mouth daily.   nystatin 100000 UNIT/ML suspension Commonly known as: MYCOSTATIN Take 2.5 mLs by mouth in the morning, at noon, and at bedtime.   oxyCODONE-acetaminophen 10-325 MG tablet Commonly known as: PERCOCET Take 1 tablet by mouth 4 (four) times daily.   progesterone 100 MG capsule Commonly known as: PROMETRIUM Take 100 mg  by mouth at bedtime.   promethazine 25 MG tablet Commonly known as: PHENERGAN Take 25 mg by mouth every 6 (six) hours as needed for nausea or vomiting.   Stiolto Respimat 2.5-2.5 MCG/ACT Aers Generic drug: Tiotropium Bromide-Olodaterol Inhale 2 puffs into the lungs daily as needed for shortness of breath.   tizanidine 2 MG capsule Commonly known as: ZANAFLEX Take 2 mg by mouth 3 (three) times daily as needed for muscle spasms.   valACYclovir 1000 MG tablet Commonly known as: VALTREX Take 1,000 mg by mouth daily as needed (fever blisters).        Follow-up Information     Ashok Pall, MD Follow up in 3 week(s).   Specialty: Neurosurgery Why: please keep or call for your appointment Contact information: [address] [place] [address] [phone]                 Signed: Ashok Pall 12/22/2021, 5:25 PM

## 2021-12-24 ENCOUNTER — Encounter (HOSPITAL_COMMUNITY): Payer: Self-pay | Admitting: Emergency Medicine

## 2021-12-24 ENCOUNTER — Other Ambulatory Visit: Payer: Self-pay

## 2021-12-24 ENCOUNTER — Emergency Department (HOSPITAL_COMMUNITY)
Admission: EM | Admit: 2021-12-24 | Discharge: 2021-12-24 | Disposition: A | Discharge status: Home / Self Care | Payer: Medicare Other | Attending: Emergency Medicine | Admitting: Emergency Medicine

## 2021-12-24 DIAGNOSIS — M5441 Lumbago with sciatica, right side: Secondary | ICD-10-CM | POA: Diagnosis not present

## 2021-12-24 DIAGNOSIS — Z20822 Contact with and (suspected) exposure to covid-19: Secondary | ICD-10-CM | POA: Diagnosis not present

## 2021-12-24 DIAGNOSIS — M5442 Lumbago with sciatica, left side: Secondary | ICD-10-CM

## 2021-12-24 DIAGNOSIS — M545 Low back pain, unspecified: Secondary | ICD-10-CM | POA: Diagnosis present

## 2021-12-24 LAB — CBC WITH DIFFERENTIAL/PLATELET
Abs Immature Granulocytes: 0.01 10*3/uL (ref 0.00–0.07)
Basophils Absolute: 0 10*3/uL (ref 0.0–0.1)
Basophils Relative: 0 %
Eosinophils Absolute: 0 10*3/uL (ref 0.0–0.5)
Eosinophils Relative: 0 %
HCT: 42.4 % (ref 36.0–46.0)
Hemoglobin: 14.1 g/dL (ref 12.0–15.0)
Immature Granulocytes: 0 %
Lymphocytes Relative: 18 %
Lymphs Abs: 1.2 10*3/uL (ref 0.7–4.0)
MCH: 30.2 pg (ref 26.0–34.0)
MCHC: 33.3 g/dL (ref 30.0–36.0)
MCV: 90.8 fL (ref 80.0–100.0)
Monocytes Absolute: 0.5 10*3/uL (ref 0.1–1.0)
Monocytes Relative: 7 %
Neutro Abs: 5.2 10*3/uL (ref 1.7–7.7)
Neutrophils Relative %: 75 %
Platelets: 283 10*3/uL (ref 150–400)
RBC: 4.67 MIL/uL (ref 3.87–5.11)
RDW: 14.5 % (ref 11.5–15.5)
WBC: 6.9 10*3/uL (ref 4.0–10.5)
nRBC: 0 % (ref 0.0–0.2)

## 2021-12-24 LAB — RESP PANEL BY RT-PCR (FLU A&B, COVID) ARPGX2
Influenza A by PCR: NEGATIVE
Influenza B by PCR: NEGATIVE
SARS Coronavirus 2 by RT PCR: NEGATIVE

## 2021-12-24 LAB — URINALYSIS, ROUTINE W REFLEX MICROSCOPIC
Bilirubin Urine: NEGATIVE
Glucose, UA: NEGATIVE mg/dL
Hgb urine dipstick: NEGATIVE
Ketones, ur: 20 mg/dL — AB
Leukocytes,Ua: NEGATIVE
Nitrite: NEGATIVE
Protein, ur: NEGATIVE mg/dL
Specific Gravity, Urine: 1.017 (ref 1.005–1.030)
pH: 5 (ref 5.0–8.0)

## 2021-12-24 LAB — COMPREHENSIVE METABOLIC PANEL
ALT: 11 U/L (ref 0–44)
AST: 16 U/L (ref 15–41)
Albumin: 3.3 g/dL — ABNORMAL LOW (ref 3.5–5.0)
Alkaline Phosphatase: 45 U/L (ref 38–126)
Anion gap: 9 (ref 5–15)
BUN: 11 mg/dL (ref 6–20)
CO2: 26 mmol/L (ref 22–32)
Calcium: 7.9 mg/dL — ABNORMAL LOW (ref 8.9–10.3)
Chloride: 96 mmol/L — ABNORMAL LOW (ref 98–111)
Creatinine, Ser: 0.61 mg/dL (ref 0.44–1.00)
GFR, Estimated: >60 mL/min (ref >60)
Glucose, Bld: 114 mg/dL — ABNORMAL HIGH (ref 70–99)
Potassium: 3.4 mmol/L — ABNORMAL LOW (ref 3.5–5.1)
Sodium: 131 mmol/L — ABNORMAL LOW (ref 135–145)
Total Bilirubin: 0.8 mg/dL (ref 0.3–1.2)
Total Protein: 6.5 g/dL (ref 6.5–8.1)

## 2021-12-24 LAB — LIPASE, BLOOD: Lipase: 20 U/L (ref 11–51)

## 2021-12-24 LAB — LACTIC ACID, PLASMA: Lactic Acid, Venous: 0.8 mmol/L (ref 0.5–1.9)

## 2021-12-24 MED ORDER — ONDANSETRON HCL 4 MG/2ML IJ SOLN
4.0000 mg | Freq: Once | INTRAMUSCULAR | Status: AC
  Administered 2021-12-24: 4 mg via INTRAVENOUS
  Filled 2021-12-24: qty 2

## 2021-12-24 MED ORDER — FENTANYL CITRATE PF 50 MCG/ML IJ SOSY
50.0000 ug | PREFILLED_SYRINGE | Freq: Once | INTRAMUSCULAR | Status: AC
  Administered 2021-12-24: 50 ug via INTRAVENOUS
  Filled 2021-12-24: qty 1

## 2021-12-24 MED ORDER — CELECOXIB 200 MG PO CAPS: 200.0000 mg | ORAL_CAPSULE | Freq: Two times a day (BID) | ORAL | 0 refills | Status: DC

## 2021-12-24 MED ORDER — SODIUM CHLORIDE 0.9 % IV BOLUS
1000.0000 mL | Freq: Once | INTRAVENOUS | Status: AC
  Administered 2021-12-24: 1000 mL via INTRAVENOUS

## 2021-12-24 MED ORDER — CYCLOBENZAPRINE HCL 10 MG PO TABS: 5.0000 mg | ORAL_TABLET | Freq: Two times a day (BID) | ORAL | 0 refills | Status: DC | PRN

## 2021-12-24 NOTE — ED Provider Triage Note (Signed)
Emergency Medicine Provider Triage Evaluation Note  Teresa Chaney , a 56 y.o. female  was evaluated in triage.  Pt complains of Neck pain.  She had surgery the 18th, went home on the 19th.   She reports that pain in her lower back started two days ago.   She started having low grade fevers two days ago, Tmax 99.9 at home.   No diarrhea, reports nausea and vomiting.   She feels like she is having to push out her urine but thinks she is emptying her bladder.   Her pain doctor gave her 15mg  oxycodone IR q6h after surgery.   She does report cough.   Last BM was yesterday   Review of Systems  Positive: See above Negative:   Physical Exam  BP 116/79 (BP Location: Left Arm)    Pulse (!) 115    Temp 98.6 F (37 C) (Oral)    Resp 20    LMP 01/11/2014    SpO2 97%  Gen:   Awake, appears uncomfortable Resp:  Normal effort  MSK:   Moves extremities without difficulty  Other:  2+ bilateral radial and PT pulses.   Medical Decision Making  Medically screening exam initiated at 12:59 PM.  Appropriate orders placed.  [name] was informed that the remainder of the evaluation will be completed by another provider, this initial triage assessment does not replace that evaluation, and the importance of remaining in the ED until their evaluation is complete.     Lorin Glass, [place] 12/24/21 1308

## 2021-12-24 NOTE — ED Triage Notes (Signed)
Patient coming from home, complaint of neck pain and fever, pt had neck surgery jan 18th, last few days had fever and increasing pain.

## 2021-12-24 NOTE — ED Triage Notes (Signed)
Pt took tylenol @ 1030 this morning.

## 2021-12-24 NOTE — Discharge Instructions (Signed)
Take your medications as prescribed. Follow up with Dr. Sonnie Alamo IMMEDIATE MEDICAL ATTENTION IF: New numbness, tingling, weakness, or problem with the use of your arms or legs.  Severe back pain not relieved with medications.  Change in bowel or bladder control.  Increasing pain in any areas of the body (such as chest or abdominal pain).  Shortness of breath, dizziness or fainting.  Nausea (feeling sick to your stomach), vomiting, fever, or sweats.

## 2021-12-24 NOTE — ED Provider Notes (Signed)
[facility] Provider Note   CSN: [id number] Arrival date & time: 12/24/21  1239     History  Chief Complaint  Patient presents with   Back Pain   Fever    Teresa Chaney is a 56 y.o. female who presents emergency [facility] with chief complaint of lower back pain.  She is status post 4 5 cervical posterior fusion by Dr. Christella Noa on 12/20/2021.  She states that about 2 days ago she began having severe lower back pain.  She has a history of low back pain and right-sided sciatica but is now complaining of bilateral sciatica.  She also feels like she has had a low-grade fever.  She denies any saddle anesthesia or weakness of the lower extremities changes in sensation, bowel or bladder incontinence or new upper extremity weakness or paresthesia.  She she is followed by pain management and is treated with oxycodone 15 mg every 6 hours which has not been helping with her pain significantly.  She denies any significant worsening in her postoperative neck pain.   Back Pain Associated symptoms: fever   Fever     Home Medications Prior to Admission medications   Medication Sig Start Date End Date Taking? Authorizing Provider  celecoxib (CELEBREX) 200 MG capsule Take 1 capsule (200 mg total) by mouth 2 (two) times daily. 12/24/21  Yes Bettyjo Lundblad, PA-C  cyclobenzaprine (FLEXERIL) 10 MG tablet Take 0.5-1 tablets (5-10 mg total) by mouth 2 (two) times daily as needed for muscle spasms. 12/24/21  Yes Lunden Stieber, PA-C  acetaminophen (TYLENOL) 500 MG tablet Take 1,500 mg by mouth 3 (three) times daily as needed for moderate pain.    [provider]  albuterol (PROVENTIL) (2.5 MG/3ML) 0.083% nebulizer solution Take 2.5 mg by nebulization every 6 (six) hours as needed for wheezing or shortness of breath.    [provider]  albuterol (VENTOLIN HFA) 108 (90 Base) MCG/ACT inhaler Inhale 2 puffs into the lungs every 6 (six) hours as needed for  wheezing or shortness of breath.    [provider]  ARIPiprazole (ABILIFY) 20 MG tablet Take 20 mg by mouth daily.    [provider]  atorvastatin (LIPITOR) 10 MG tablet Take 10 mg by mouth at bedtime.    [provider]  budesonide (PULMICORT) 0.5 MG/2ML nebulizer solution Take 0.5 mg by nebulization 2 (two) times daily as needed (shortness of breath).    [provider]  calcitRIOL (ROCALTROL) 0.5 MCG capsule Take 0.5 mcg by mouth daily.    [provider]  dexlansoprazole (DEXILANT) 60 MG capsule Take 60 mg by mouth daily.    [provider]  diazepam (VALIUM) 10 MG tablet Take 10 mg by mouth daily as needed for anxiety. 11/05/21   [provider]  estradiol (ESTRACE) 1 MG tablet Take 1 mg by mouth daily.    [provider]  fluticasone (FLONASE) 50 MCG/ACT nasal spray Place 2 sprays into both nostrils daily as needed for allergies or rhinitis.    [provider]  gabapentin (NEURONTIN) 400 MG capsule Take 400 mg by mouth 3 (three) times daily.    [provider]  levothyroxine (SYNTHROID) 88 MCG tablet Take 88 mcg by mouth daily before breakfast.    [provider]  lubiprostone (AMITIZA) 24 MCG capsule Take 24 mcg by mouth 2 (two) times daily with a meal.    [provider]  meclizine (ANTIVERT) 12.5 MG tablet Take 12.5 mg by  mouth 3 (three) times daily as needed for dizziness. 10/24/21   [provider]  methocarbamol (ROBAXIN) 500 MG tablet Take 500 mg by mouth 3 (three) times daily.    [provider]  Multiple Vitamins-Minerals (MULTIVITAMIN WITH MINERALS) tablet Take 1 tablet by mouth daily.    [provider]  nystatin (MYCOSTATIN) 100000 UNIT/ML suspension Take 2.5 mLs by mouth in the morning, at noon, and at bedtime. 10/04/21   [provider]  oxyCODONE-acetaminophen (PERCOCET) 10-325 MG tablet Take 1 tablet by mouth 4 (four) times daily.  04/17/21   [provider]  progesterone (PROMETRIUM) 100 MG capsule Take 100 mg by mouth at bedtime. 11/03/21   [provider]  promethazine (PHENERGAN) 25 MG tablet Take 25 mg by mouth every 6 (six) hours as needed for nausea or vomiting.    [provider]  STIOLTO RESPIMAT 2.5-2.5 MCG/ACT AERS Inhale 2 puffs into the lungs daily as needed for shortness of breath. 11/20/21   [provider]  tizanidine (ZANAFLEX) 2 MG capsule Take 2 mg by mouth 3 (three) times daily as needed for muscle spasms.    [provider]  valACYclovir (VALTREX) 1000 MG tablet Take 1,000 mg by mouth daily as needed (fever blisters). 04/26/21   [provider]      Allergies    Aspirin, Baclofen, Codeine, Pregabalin, Soma [carisoprodol], Tape, Tussin [guaifenesin], and Sertraline    Review of Systems   Review of Systems  Constitutional:  Positive for fever.  Musculoskeletal:  Positive for back pain.   Physical Exam Updated Vital Signs BP 129/72    Pulse (!) 103    Temp 98.4 F (36.9 C)    Resp 17    LMP 01/11/2014    SpO2 94%  Physical Exam Vitals and nursing note reviewed.  Constitutional:      General: She is not in acute distress.    Appearance: She is well-developed. She is not ill-appearing or diaphoretic.  HENT:     Head: Normocephalic and atraumatic.     Right Ear: External ear normal.     Left Ear: External ear normal.     Nose: Nose normal.     Mouth/Throat:     Mouth: Mucous membranes are moist.  Eyes:     General: No scleral icterus.    Conjunctiva/sclera: Conjunctivae normal.  Neck:     Comments: Posterior surgical incision appears well-healing stitches and surgical glue in place Cardiovascular:     Rate and Rhythm: Normal rate and regular rhythm.     Heart sounds: Normal heart sounds. No murmur heard.   No friction rub. No gallop.  Pulmonary:     Effort: Pulmonary effort is normal. No respiratory distress.     Breath sounds: Normal  breath sounds.  Abdominal:     General: Bowel sounds are normal. There is no distension.     Palpations: Abdomen is soft. There is no mass.     Tenderness: There is no abdominal tenderness. There is no guarding.  Skin:    General: Skin is warm and dry.  Neurological:     Mental Status: She is alert and oriented to person, place, and time.     GCS: GCS eye subscore is 4. GCS verbal subscore is 5. GCS motor subscore is 6.     Cranial Nerves: Cranial nerves 2-12 are intact.     Motor: Motor function is intact. No weakness or tremor.     Deep Tendon Reflexes:  Reflex Scores:      Patellar reflexes are 2+ on the right side and 2+ on the left side. Psychiatric:        Behavior: Behavior normal.    ED Results / Procedures / Treatments   Labs (all labs ordered are listed, but only abnormal results are displayed) Labs Reviewed  COMPREHENSIVE METABOLIC PANEL - Abnormal; Notable for the following components:      Result Value   Sodium 131 (*)    Potassium 3.4 (*)    Chloride 96 (*)    Glucose, Bld 114 (*)    Calcium 7.9 (*)    Albumin 3.3 (*)    All other components within normal limits  URINALYSIS, ROUTINE W REFLEX MICROSCOPIC - Abnormal; Notable for the following components:   APPearance CLOUDY (*)    Ketones, ur 20 (*)    All other components within normal limits  RESP PANEL BY RT-PCR (FLU A&B, COVID) ARPGX2  CBC WITH DIFFERENTIAL/PLATELET  LACTIC ACID, PLASMA  LIPASE, BLOOD    EKG None  Radiology No results found.  Procedures Procedures   Medications Ordered in ED Medications  fentaNYL (SUBLIMAZE) injection 50 mcg (50 mcg Intravenous Given 12/24/21 1628)  ondansetron (ZOFRAN) injection 4 mg (4 mg Intravenous Given 12/24/21 1627)  sodium chloride 0.9 % bolus 1,000 mL (0 mLs Intravenous Stopped 12/24/21 1751)  fentaNYL (SUBLIMAZE) injection 50 mcg (50 mcg Intravenous Given 12/24/21 1700)    ED Course/ Medical Decision Making/ A&P Clinical Course as of 12/24/21 2341   Sun Dec 24, 2021  1607 Case discussed with PA Glenford Peers of Neurosurgery.  Feels that her lower back pain is likely due to decreased movement and bedrest due to recent neck surgery.  He does not feel that she needs any further imaging with MRI [AH]    Clinical Course User Index [AH] Margarita Mail, PA-C                           Medical Decision Making Risk Prescription drug management.  56 year old female with a history of recent neck surgery, I considered potential for epidural abscess and need for additional imaging with MRI however after review of information with neurosurgical PA does not feel that she needs this at this time.  I reviewed and interpreted labs including CBC, lactic acid, lipase, respiratory panel all within normal limits, CMP shows mild Lee elevated blood glucose of insignificant value.  Patient will be discharged with Celebrex and muscle relaxers.  She has close outpatient follow-up with neurosurgery Final Clinical Impression(s) / ED Diagnoses Final diagnoses:  Acute bilateral low back pain with bilateral sciatica    Rx / DC Orders ED Discharge Orders          Ordered    celecoxib (CELEBREX) 200 MG capsule  2 times daily        12/24/21 1640    cyclobenzaprine (FLEXERIL) 10 MG tablet  2 times daily PRN        12/24/21 1640              Margarita Mail, PA-C 12/24/21 2342    Malvin Johns, MD 12/24/21 [phone]

## 2023-12-24 IMAGING — RF DG CERVICAL SPINE 1V
1 series · 1 of 1 positions shown · non-contrast
Comparison: CT done on 11/01/2021

CLINICAL DATA: Neck pain

EXAM:
DG CERVICAL SPINE - 1 VIEW

[Series 1: run · 1 of 1 slices shown]
[im 1/1]
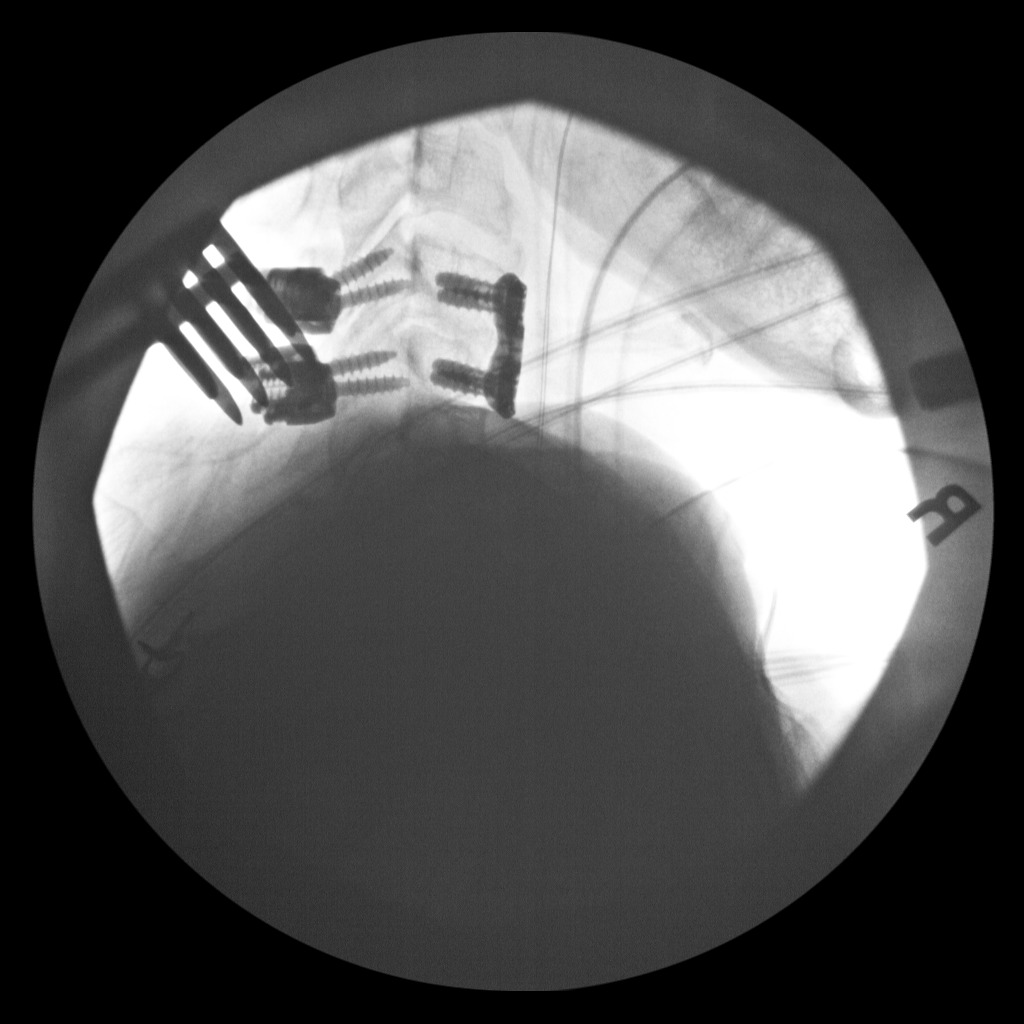

[1 of 1 positions shown; findings below may reference images not displayed]

FINDINGS: Fluoroscopic image shows interval posterior surgical fusion at C4-C5
level. There is evidence of previous anterior cervical disc fusion
at C4-C5 level.
IMPRESSION: Fluoroscopic assistance was provided for posterior surgical fusion
at C4-C5 level.

## 2024-04-03 ENCOUNTER — Encounter: Payer: Self-pay | Admitting: Gastroenterology

## 2024-12-25 ENCOUNTER — Encounter (HOSPITAL_BASED_OUTPATIENT_CLINIC_OR_DEPARTMENT_OTHER): Payer: Self-pay

## 2024-12-25 ENCOUNTER — Other Ambulatory Visit (HOSPITAL_BASED_OUTPATIENT_CLINIC_OR_DEPARTMENT_OTHER): Payer: Self-pay

## 2024-12-25 ENCOUNTER — Ambulatory Visit (HOSPITAL_BASED_OUTPATIENT_CLINIC_OR_DEPARTMENT_OTHER): Admission: EM | Admit: 2024-12-25 | Discharge: 2024-12-25 | Disposition: A | Discharge status: Home / Self Care

## 2024-12-25 ENCOUNTER — Ambulatory Visit (HOSPITAL_BASED_OUTPATIENT_CLINIC_OR_DEPARTMENT_OTHER): Admitting: Radiology

## 2024-12-25 DIAGNOSIS — M542 Cervicalgia: Secondary | ICD-10-CM | POA: Diagnosis not present

## 2024-12-25 DIAGNOSIS — G43819 Other migraine, intractable, without status migrainosus: Secondary | ICD-10-CM

## 2024-12-25 DIAGNOSIS — R051 Acute cough: Secondary | ICD-10-CM

## 2024-12-25 DIAGNOSIS — R0902 Hypoxemia: Secondary | ICD-10-CM

## 2024-12-25 DIAGNOSIS — J441 Chronic obstructive pulmonary disease with (acute) exacerbation: Secondary | ICD-10-CM

## 2024-12-25 DIAGNOSIS — R110 Nausea: Secondary | ICD-10-CM | POA: Diagnosis not present

## 2024-12-25 MED ORDER — KETOROLAC TROMETHAMINE 10 MG PO TABS
10.0000 mg | ORAL_TABLET | Freq: Four times a day (QID) | ORAL | 0 refills | Status: AC | PRN
  Filled 2024-12-25: qty 10, 3d supply, fill #0

## 2024-12-25 MED ORDER — IPRATROPIUM-ALBUTEROL 0.5-2.5 (3) MG/3ML IN SOLN
3.0000 mL | Freq: Once | RESPIRATORY_TRACT | Status: AC
  Administered 2024-12-25: 3 mL via RESPIRATORY_TRACT

## 2024-12-25 MED ORDER — KETOROLAC TROMETHAMINE 30 MG/ML IJ SOLN
30.0000 mg | Freq: Once | INTRAMUSCULAR | Status: AC
  Administered 2024-12-25: 30 mg via INTRAMUSCULAR

## 2024-12-25 MED ORDER — TRIAMCINOLONE ACETONIDE 40 MG/ML IJ SUSP
40.0000 mg | Freq: Once | INTRAMUSCULAR | Status: AC
  Administered 2024-12-25: 40 mg via INTRAMUSCULAR

## 2024-12-25 MED ORDER — ONDANSETRON 4 MG PO TBDP
4.0000 mg | ORAL_TABLET | Freq: Once | ORAL | Status: AC
  Administered 2024-12-25: 4 mg via ORAL

## 2024-12-25 MED ORDER — METHOCARBAMOL 500 MG PO TABS
500.0000 mg | ORAL_TABLET | Freq: Two times a day (BID) | ORAL | 0 refills | Status: AC | PRN
  Filled 2024-12-25: qty 10, 5d supply, fill #0

## 2024-12-25 MED ORDER — ONDANSETRON 4 MG PO TBDP
4.0000 mg | ORAL_TABLET | Freq: Three times a day (TID) | ORAL | 0 refills | Status: AC | PRN
  Filled 2024-12-25: qty 20, 7d supply, fill #0

## 2024-12-25 NOTE — ED Provider Notes (Signed)
 " Teresa Chaney    CSN: [id number] Arrival date & time: 12/25/24  1041      History   Chief Complaint Chief Complaint  Patient presents with   Migraine    HPI Teresa Chaney is a 60 y.o. female.   59 year old female with many complex medical diagnoses including, but not limited to COPD emphysema, migraines, previous neck fracture and chronic neck pain, hyperlipidemia, anxiety, depression and bipolar disorder.  She is here today with neck pain that started on approximately 12/21/2024 or earlier.  She reports that most of the time, if the neck pain starts then she eventually develops a migraine.  She has had the migraine since 12/23/2024.  She has taken Nurtec and had no relief of the migraine.  She reports that she has taken muscle relaxants like cyclobenzaprine  for her neck in the past with poor relief.  She is not aware of taking baclofen or Robaxin in the past.  She has an allergy to aspirin listed but reports Toradol shots have helped her migraines well.  Once the migraine started she developed nausea and has vomited a few times.  She has not taken anything for the migraine today.  She denies fever, body aches, diarrhea, constipation.  She was found to have low oxygen during triage.  She reports that she does not use oxygen at home but she often uses a nebulizer treatment and inhalers if she feels short of breath.  She is a long-term everyday smoker.  She reports that when checked at doctors offices her oxygen levels are regularly 94% or higher.  She is not aware of ever having an oxygen level below 90.  She developed a mild cough yesterday.  She denies wheezing or shortness of breath at this time.   Migraine Associated symptoms include headaches. Pertinent negatives include no chest pain, no abdominal pain and no shortness of breath.    Past Medical History:  Diagnosis Date   Allergy    Anemia    Anxiety    Arthritis    Bipolar disorder (HCC)    Blood transfusion without  reported diagnosis    COPD (chronic obstructive pulmonary disease) (HCC)    Depression    Emphysema lung (HCC)    GERD (gastroesophageal reflux disease)    Headache    Heart murmur    Hip fracture (HCC)    Hyperlipidemia    Hypothyroidism    Neck fracture (HCC)    Neuromuscular disorder (HCC)    Osteoporosis    Suicidal behavior    Thyroid disease     Patient Active Problem List   Diagnosis Date Noted   Cervical pseudoarthrosis (HCC) 12/20/2021    Past Surgical History:  Procedure Laterality Date   CHOLECYSTECTOMY     CLAVICLE SURGERY     HERNIA REPAIR     HIP SURGERY     NECK SURGERY     POSTERIOR CERVICAL FUSION/FORAMINOTOMY N/A 12/20/2021   Procedure: Cervical four-five Posterior cervical fusion with lateral mass fixation;  Surgeon: Gillie Duncans, MD;  Location: [facility] OR;  Service: Neurosurgery;  Laterality: N/A;   THYROIDECTOMY  2021   TUBAL LIGATION      OB History   No obstetric history on file.      Home Medications    Prior to Admission medications  Medication Sig Start Date End Date Taking? Authorizing Provider  alprazolam (XANAX) 2 MG tablet Take 2 mg by mouth 2 (two) times daily as needed. 12/20/24  Yes [provider]  ketorolac (TORADOL) 10 MG tablet Take 1 tablet (10 mg total) by mouth every 6 (six) hours as needed. 12/25/24  Yes Ival Domino, FNP  methocarbamol (ROBAXIN) 500 MG tablet Take 1 tablet (500 mg total) by mouth every 12 (twelve) hours as needed for muscle spasms (Do not use and drive). 12/25/24  Yes Ival Domino, FNP  ondansetron  (ZOFRAN -ODT) 4 MG disintegrating tablet Take 1 tablet (4 mg total) by mouth every 8 (eight) hours as needed for nausea or vomiting. 12/25/24  Yes Ival Domino, FNP  acetaminophen  (TYLENOL ) 500 MG tablet Take 1,500 mg by mouth 3 (three) times daily as needed for moderate pain.    [provider]  albuterol  (PROVENTIL ) (2.5 MG/3ML) 0.083% nebulizer solution Take 2.5 mg by nebulization every 6 (six) hours  as needed for wheezing or shortness of breath.    [provider]  albuterol  (VENTOLIN  HFA) 108 (90 Base) MCG/ACT inhaler Inhale 2 puffs into the lungs every 6 (six) hours as needed for wheezing or shortness of breath.    [provider]  ARIPiprazole  (ABILIFY ) 20 MG tablet Take 20 mg by mouth daily.    [provider]  atorvastatin  (LIPITOR) 10 MG tablet Take 10 mg by mouth at bedtime.    [provider]  calcitRIOL  (ROCALTROL ) 0.5 MCG capsule Take 0.5 mcg by mouth daily.    [provider]  dexlansoprazole (DEXILANT) 60 MG capsule Take 60 mg by mouth daily.    [provider]  estradiol  (ESTRACE ) 1 MG tablet Take 1 mg by mouth daily.    [provider]  fluticasone  (FLONASE ) 50 MCG/ACT nasal spray Place 2 sprays into both nostrils daily as needed for allergies or rhinitis.    [provider]  levothyroxine  (SYNTHROID ) 88 MCG tablet Take 88 mcg by mouth daily before breakfast.    [provider]  lubiprostone  (AMITIZA ) 24 MCG capsule Take 24 mcg by mouth 2 (two) times daily with a meal.    [provider]  meclizine  (ANTIVERT ) 12.5 MG tablet Take 12.5 mg by mouth 3 (three) times daily as needed for dizziness. 10/24/21   [provider]  Multiple Vitamins-Minerals (MULTIVITAMIN WITH MINERALS) tablet Take 1 tablet by mouth daily.    [provider]  NURTEC 75 MG TBDP Take by mouth.    [provider]  oxyCODONE -acetaminophen  (PERCOCET) 10-325 MG tablet Take 1 tablet by mouth 4 (four) times daily. 04/17/21   [provider]  progesterone  (PROMETRIUM ) 100 MG capsule Take 100 mg by mouth at bedtime. 11/03/21   [provider]  promethazine  (PHENERGAN ) 25 MG tablet Take 25 mg by mouth every 6 (six) hours as needed for nausea or vomiting.    [provider]  valACYclovir  (VALTREX ) 1000 MG tablet Take 1,000 mg by mouth daily as needed (fever blisters). 04/26/21    [provider]    Family History Family History  Problem Relation Age of Onset   Colon cancer Neg Hx    Esophageal cancer Neg Hx    Rectal cancer Neg Hx    Stomach cancer Neg Hx     Social History Social History[1]   Allergies   Aspirin, Baclofen, Codeine, Pregabalin, Soma [carisoprodol], Tape, Tussin [guaifenesin], and Sertraline   Review of Systems Review of Systems  Constitutional:  Negative for chills and fever.  HENT:  Positive for congestion, postnasal drip and rhinorrhea. Negative for ear pain and sore throat.   Eyes:  Negative for pain and visual disturbance.  Respiratory:  Positive for cough. Negative for shortness of breath.   Cardiovascular:  Negative for chest pain and palpitations.  Gastrointestinal:  Positive for nausea and vomiting. Negative for abdominal pain, constipation and diarrhea.  Genitourinary:  Negative for dysuria and hematuria.  Musculoskeletal:  Positive for neck pain. Negative for arthralgias and back pain.  Skin:  Negative for color change and rash.  Neurological:  Positive for headaches. Negative for seizures and syncope.  All other systems reviewed and are negative.    Physical Exam Triage Vital Signs ED Triage Vitals [12/25/24 1107]  Encounter Vitals Group     BP      Girls Systolic BP Percentile      Girls Diastolic BP Percentile      Boys Systolic BP Percentile      Boys Diastolic BP Percentile      Pulse      Resp      Temp      Temp src      SpO2      Weight      Height      Head Circumference      Peak Flow      Pain Score 10     Pain Loc      Pain Education      Exclude from Growth Chart    No data found.  Updated Vital Signs BP 115/80 (BP Location: Right Arm)   Pulse 94   Temp 98.4 F (36.9 C) (Oral)   Resp 20   LMP 01/11/2014   SpO2 96%   Visual Acuity Right Eye Distance:   Left Eye Distance:   Bilateral Distance:    Right Eye Near:   Left Eye Near:    Bilateral Near:     Physical  Exam Vitals and nursing note reviewed.  Constitutional:      General: She is not in acute distress.    Appearance: She is well-developed. She is ill-appearing. She is not toxic-appearing or diaphoretic.  HENT:     Head: Normocephalic and atraumatic.     Right Ear: Hearing, tympanic membrane, ear canal and external ear normal.     Left Ear: Hearing, tympanic membrane, ear canal and external ear normal.     Nose: No congestion or rhinorrhea.     Right Sinus: No maxillary sinus tenderness or frontal sinus tenderness.     Left Sinus: No maxillary sinus tenderness or frontal sinus tenderness.     Mouth/Throat:     Lips: Pink.     Mouth: Mucous membranes are moist.     Pharynx: Uvula midline. No oropharyngeal exudate or posterior oropharyngeal erythema.     Tonsils: No tonsillar exudate.  Eyes:     Conjunctiva/sclera: Conjunctivae normal.     Pupils: Pupils are equal, round, and reactive to light.  Cardiovascular:     Rate and Rhythm: Normal rate and regular rhythm.     Heart sounds: S1 normal and S2 normal. No murmur heard. Pulmonary:     Effort: Pulmonary effort is normal. No respiratory distress.     Breath sounds: Examination of the right-upper field reveals wheezing and rhonchi. Examination of the left-upper field reveals wheezing and rhonchi. Wheezing and rhonchi present. No decreased breath sounds or rales.     Comments: Initial oxygen saturation was 84%.  Oxygen saturation up to 94% with oxygen at 2 L/min per nasal cannula.  Posterior breath sounds sounded clear but upper anterior chest had some rhonchi and some wheezing noted  during the exam.  Reassessment after DuoNeb treatment: Breath sounds cleared after DuoNeb treatment.  Patient's oxygen saturation was 96% on room air 5 minutes off of oxygen.  She feels like she is breathing better. Abdominal:     General: Bowel sounds are normal.     Palpations: Abdomen is soft.     Tenderness: There is no abdominal tenderness.   Musculoskeletal:        General: No swelling.     Cervical back: Neck supple.  Lymphadenopathy:     Head:     Right side of head: No submental, submandibular, tonsillar, preauricular or posterior auricular adenopathy.     Left side of head: No submental, submandibular, tonsillar, preauricular or posterior auricular adenopathy.     Cervical: No cervical adenopathy.     Right cervical: No superficial cervical adenopathy.    Left cervical: No superficial cervical adenopathy.  Skin:    General: Skin is warm and dry.     Capillary Refill: Capillary refill takes less than 2 seconds.     Findings: No rash.  Neurological:     Mental Status: She is alert and oriented to person, place, and time.  Psychiatric:        Mood and Affect: Mood normal.      UC Treatments / Results  Labs (all labs ordered are listed, but only abnormal results are displayed) Labs Reviewed - No data to display  EKG   Radiology No results found.  Procedures Procedures (including critical Chaney time)  Medications Ordered in UC Medications  ipratropium-albuterol  (DUONEB) 0.5-2.5 (3) MG/3ML nebulizer solution 3 mL (3 mLs Nebulization Given 12/25/24 1209)  ketorolac (TORADOL) 30 MG/ML injection 30 mg (30 mg Intramuscular Given 12/25/24 1157)  ondansetron  (ZOFRAN -ODT) disintegrating tablet 4 mg (4 mg Oral Given 12/25/24 1157)  triamcinolone acetonide (KENALOG-40) injection 40 mg (40 mg Intramuscular Given 12/25/24 1244)    Initial Impression / Assessment and Plan / UC Course  I have reviewed the triage vital signs and the nursing notes.  Pertinent labs & imaging results that were available during my Chaney of the patient were reviewed by me and considered in my medical decision making (see chart for details).  Plan of Chaney (see discharge instructions for additional patient precautions and education): Migraine headache with neck pain and nausea without vomiting: Ketorolac 30 mg injection during the visit.  Patient  had some resolution of her migraine headache with the use of ketorolac.  Use ketorolac 10 mg every 6 hours if needed for migraine headache.  Added methocarbamol 500 mg, 1 pill every 12 hours if needed for neck spasm or muscle spasms.  Do not use the methocarbamol and drive.  Hypoxia with cough and COPD exacerbation: Patient does not have any acute symptoms of the flu and flu testing is not available at the clinic today.  This appears to be a COPD exacerbation.  Her chest x-ray is negative.  She had a huge improvement in her breath sounds and oxygenation with the DuoNeb treatment.  Continue inhalers as previously prescribed.  Kenalog 40 mg injection once during the visit.  Prior to the injection the patient and I discussed the risks associated with Kenalog and Toradol/ketorolac for possible gastric ulcer.  Patient is on dexlansoprazole/Dexilant 60 mg daily for acid reflux.  She will continue her Dexilant and be very cautious with the use of the Toradol/ketorolac for the migraines.  She is not on any blood thinners.  Follow-up with primary Chaney or pulmonology if symptoms do  not improve, worsen or new symptoms occur.  Patient may need referral to neurology from primary Chaney if the migraines persist.  I reviewed the plan of Chaney with the patient and/or the patient's guardian.  The patient and/or guardian had time to ask questions and acknowledged that the questions were answered.  Final Clinical Impressions(s) / UC Diagnoses   Final diagnoses:  Hypoxia  Other migraine without status migrainosus, intractable  Cervicalgia  Nausea without vomiting  Acute cough  COPD exacerbation (HCC)     Discharge Instructions      Migraine headache with neck pain and nausea without vomiting: Ketorolac 30 mg injection during the visit.  Patient had some resolution of her migraine headache with the use of ketorolac.  Use ketorolac 10 mg every 6 hours if needed for migraine headache.  Added methocarbamol 500 mg, 1 pill  every 12 hours if needed for neck spasm or muscle spasms.  Do not use the methocarbamol and drive.  Hypoxia with cough and COPD exacerbation: Patient does not have any acute symptoms of the flu and flu testing is not available at the clinic today.  This appears to be a COPD exacerbation.  Her chest x-ray is negative.  She had a huge improvement in her breath sounds and oxygenation with the DuoNeb treatment.  Continue inhalers as previously prescribed.  Kenalog 40 mg injection once during the visit.  Prior to the injection the patient and I discussed the risks associated with Kenalog and Toradol/ketorolac for possible gastric ulcer.  Patient is on dexlansoprazole/Dexilant 60 mg daily for acid reflux.  She will continue her Dexilant and be very cautious with the use of the Toradol/ketorolac for the migraines.  She is not on any blood thinners.  Follow-up with primary Chaney or pulmonology if symptoms do not improve, worsen or new symptoms occur.  Patient may need referral to neurology from primary Chaney if the migraines persist.     ED Prescriptions     Medication Sig Dispense Auth. Provider   ondansetron  (ZOFRAN -ODT) 4 MG disintegrating tablet Take 1 tablet (4 mg total) by mouth every 8 (eight) hours as needed for nausea or vomiting. 20 tablet Mayjor Ager, FNP   ketorolac (TORADOL) 10 MG tablet Take 1 tablet (10 mg total) by mouth every 6 (six) hours as needed. 10 tablet Sorren Vallier, FNP   methocarbamol (ROBAXIN) 500 MG tablet Take 1 tablet (500 mg total) by mouth every 12 (twelve) hours as needed for muscle spasms (Do not use and drive). 10 tablet Corrion Stirewalt, FNP      PDMP not reviewed this encounter.    [1]  Social History Tobacco Use   Smoking status: Every Day    Current packs/day: 0.50    Types: Cigarettes   Smokeless tobacco: Never  Vaping Use   Vaping status: Never Used  Substance Use Topics   Alcohol use: No   Drug use: Never     Ival Domino, FNP 12/25/24 1246  "

## 2024-12-25 NOTE — Discharge Instructions (Signed)
 Migraine headache with neck pain and nausea without vomiting: Ketorolac 30 mg injection during the visit.  Patient had some resolution of her migraine headache with the use of ketorolac.  Use ketorolac 10 mg every 6 hours if needed for migraine headache.  Added methocarbamol 500 mg, 1 pill every 12 hours if needed for neck spasm or muscle spasms.  Do not use the methocarbamol and drive.  Hypoxia with cough and COPD exacerbation: Patient does not have any acute symptoms of the flu and flu testing is not available at the clinic today.  This appears to be a COPD exacerbation.  Her chest x-ray is negative.  She had a huge improvement in her breath sounds and oxygenation with the DuoNeb treatment.  Continue inhalers as previously prescribed.  Kenalog 40 mg injection once during the visit.  Prior to the injection the patient and I discussed the risks associated with Kenalog and Toradol/ketorolac for possible gastric ulcer.  Patient is on dexlansoprazole/Dexilant 60 mg daily for acid reflux.  She will continue her Dexilant and be very cautious with the use of the Toradol/ketorolac for the migraines.  She is not on any blood thinners.  Follow-up with primary care or pulmonology if symptoms do not improve, worsen or new symptoms occur.  Patient may need referral to neurology from primary care if the migraines persist.

## 2024-12-25 NOTE — ED Triage Notes (Signed)
 Pt c/o migraine with light sensitivity, nausea, and vomiting for the last 2 days. She states she had had issues with her neck and had had surgery on it previously and when the pain starts in her neck she will have a migraine for around 5 days. She took a Nurtec yesterday with no relief. She has not taken anything today.

## 2024-12-27 DIAGNOSIS — I361 Nonrheumatic tricuspid (valve) insufficiency: Secondary | ICD-10-CM | POA: Diagnosis not present
